# Patient Record
Sex: Female | Born: 1980
Health system: Southern US, Community
[De-identification: ages and names within clinical notes are randomized; demographics above are authoritative.]

## PROBLEM LIST (undated history)

## (undated) ENCOUNTER — Ambulatory Visit: Admission: EM | Payer: 59 | Source: Home / Self Care

## (undated) DIAGNOSIS — R87629 Unspecified abnormal cytological findings in specimens from vagina: Secondary | ICD-10-CM

## (undated) HISTORY — DX: Unspecified abnormal cytological findings in specimens from vagina: R87.629

## (undated) HISTORY — PX: TOE SURGERY: SHX1073

---

## 2004-03-09 ENCOUNTER — Other Ambulatory Visit: Admission: RE | Admit: 2004-03-09 | Discharge: 2004-03-09 | Payer: Self-pay | Admitting: Obstetrics and Gynecology

## 2004-03-14 ENCOUNTER — Ambulatory Visit (HOSPITAL_COMMUNITY): Admission: RE | Admit: 2004-03-14 | Discharge: 2004-03-14 | Payer: Self-pay | Admitting: Obstetrics and Gynecology

## 2004-05-25 ENCOUNTER — Ambulatory Visit (HOSPITAL_COMMUNITY): Admission: RE | Admit: 2004-05-25 | Discharge: 2004-05-25 | Payer: Self-pay | Admitting: Obstetrics and Gynecology

## 2004-08-15 ENCOUNTER — Ambulatory Visit (HOSPITAL_COMMUNITY): Admission: RE | Admit: 2004-08-15 | Discharge: 2004-08-15 | Payer: Self-pay | Admitting: Obstetrics and Gynecology

## 2004-08-21 ENCOUNTER — Ambulatory Visit: Payer: Self-pay | Admitting: *Deleted

## 2004-08-21 ENCOUNTER — Inpatient Hospital Stay (HOSPITAL_COMMUNITY): Admission: AD | Admit: 2004-08-21 | Discharge: 2004-08-28 | Payer: Self-pay | Admitting: Obstetrics & Gynecology

## 2005-11-28 ENCOUNTER — Other Ambulatory Visit: Admission: RE | Admit: 2005-11-28 | Discharge: 2005-11-28 | Payer: Self-pay | Admitting: Obstetrics and Gynecology

## 2005-11-29 IMAGING — US US OB COMP +14 WK
1 series · 13 of 28 positions shown · non-contrast
Comparison: none

CLINICAL DATA: Twin gestation with 19 week 3 day assigned gestational age by prior ultrasound.  Evaluate fetal anatomy and growth.

[Series 1: unknown · 0.23mm/px · 13 of 148 slices shown]
[im 6/148]
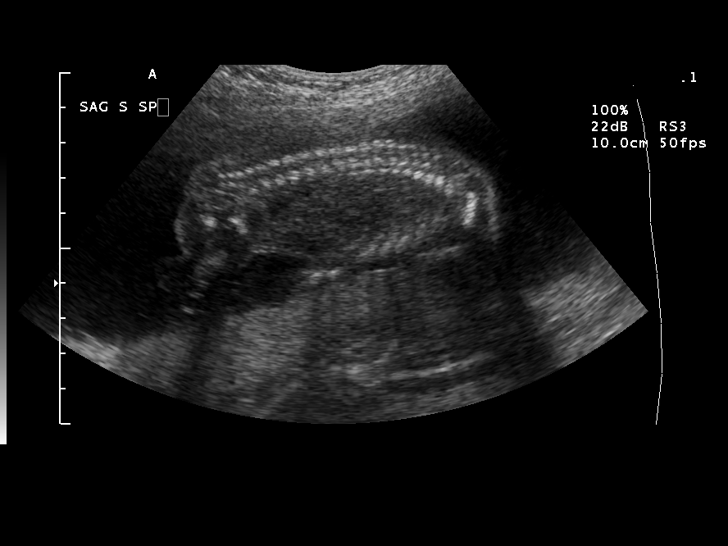
[im 17/148]
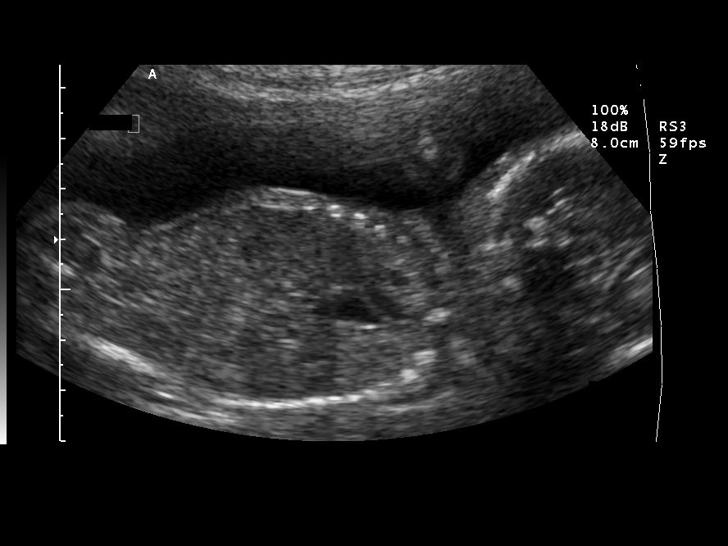
[im 28/148]
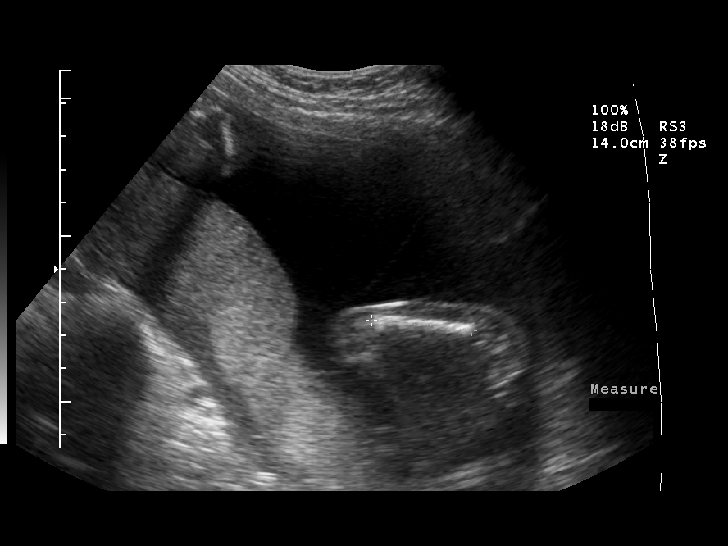
[im 39/148]
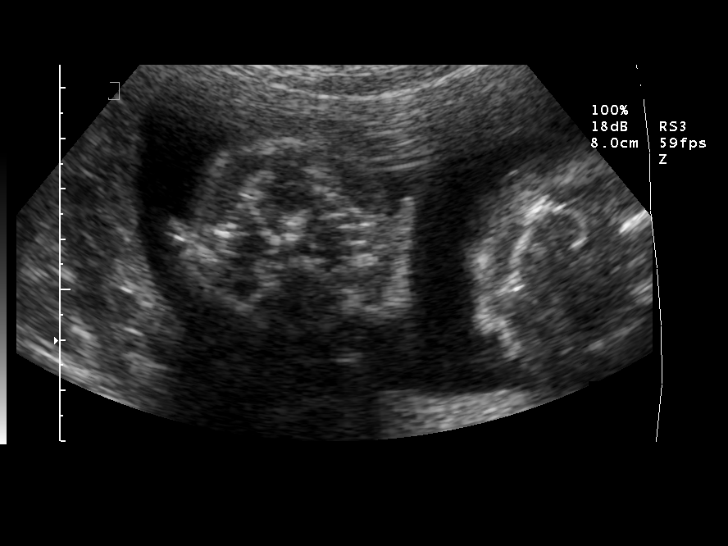
[im 50/148]
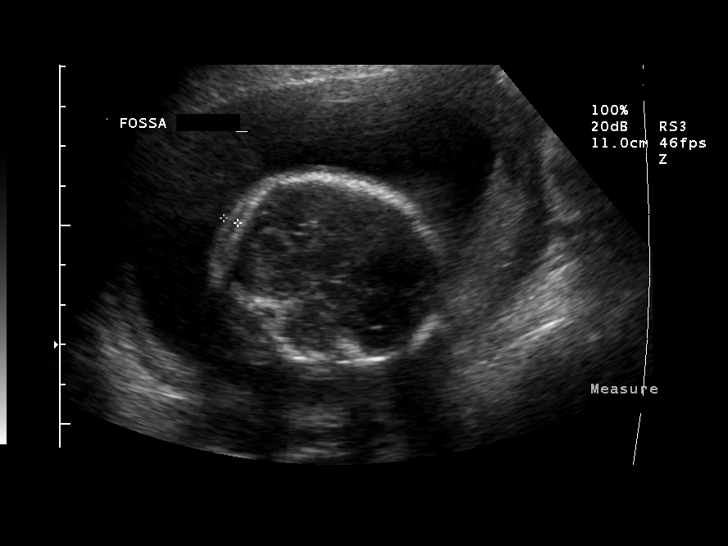
[im 60/148]
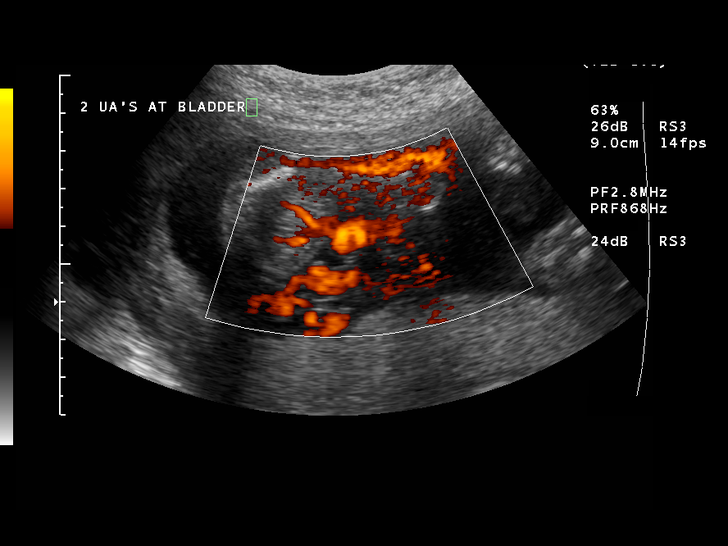
[im 77/148]
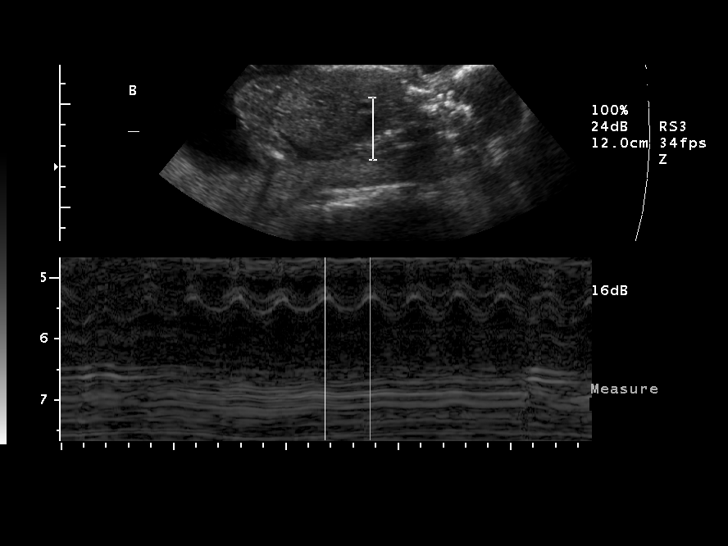
[im 88/148]
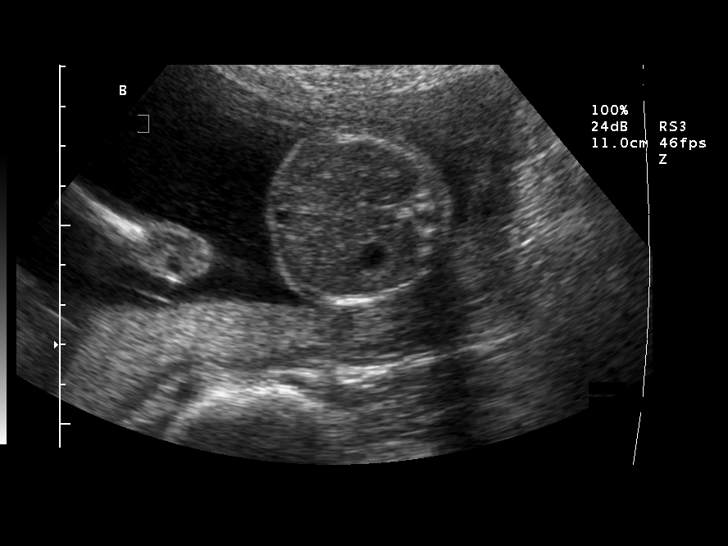
[im 99/148]
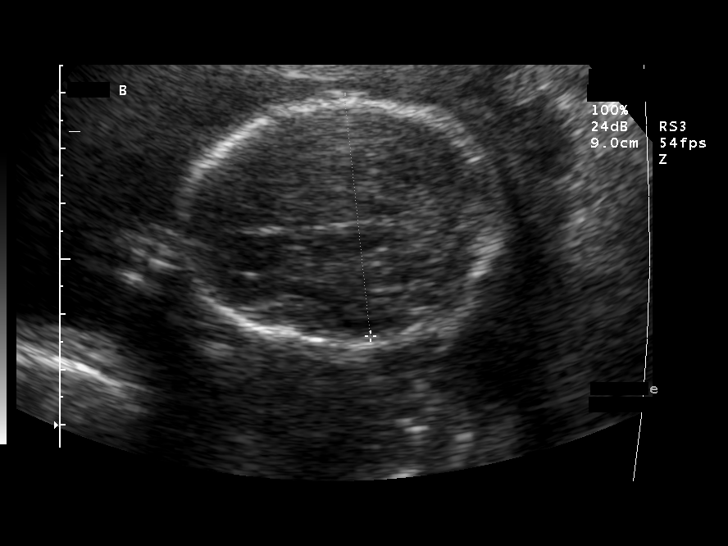
[im 109/148]
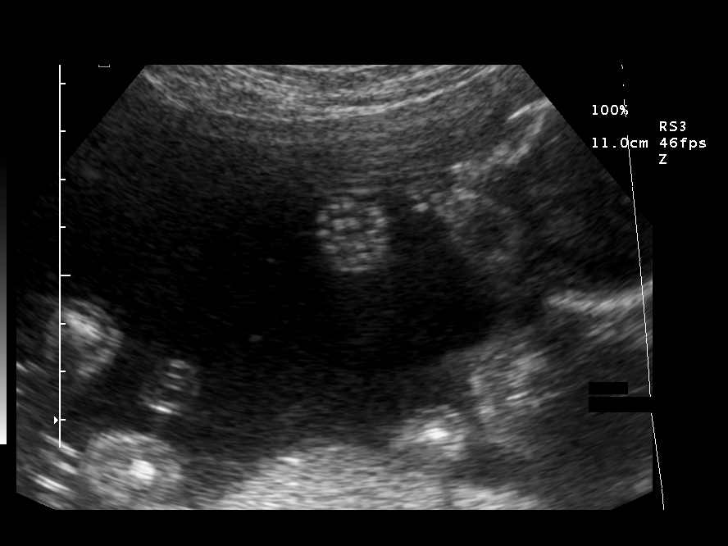
[im 120/148]
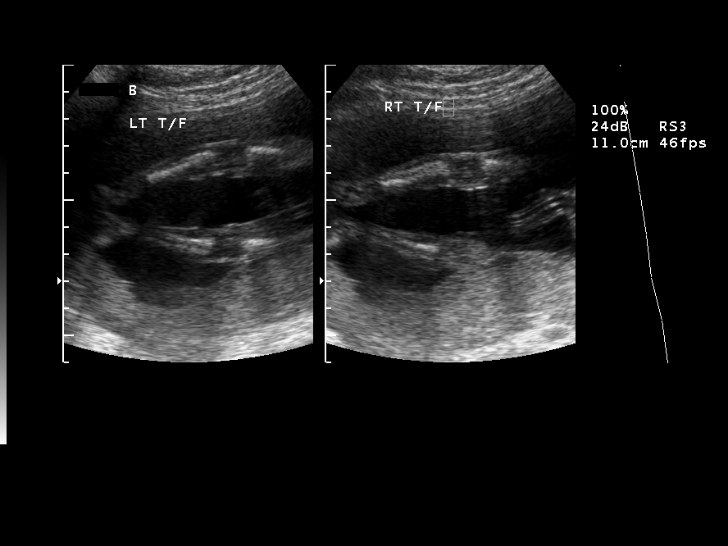
[im 131/148]
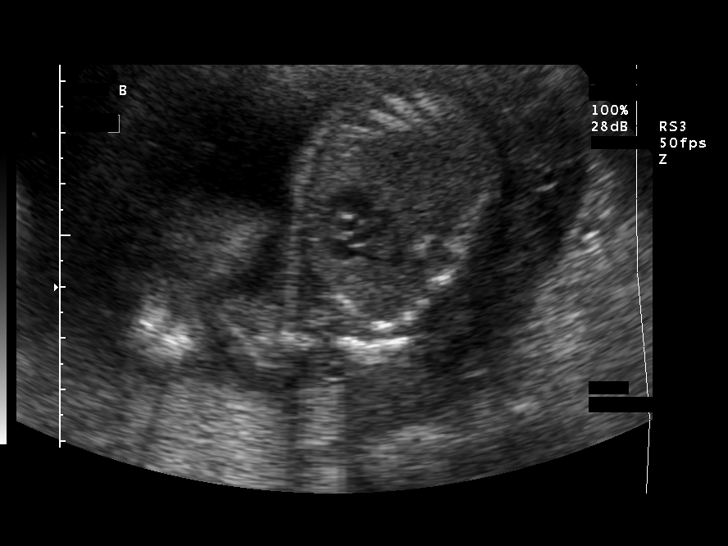
[im 142/148]
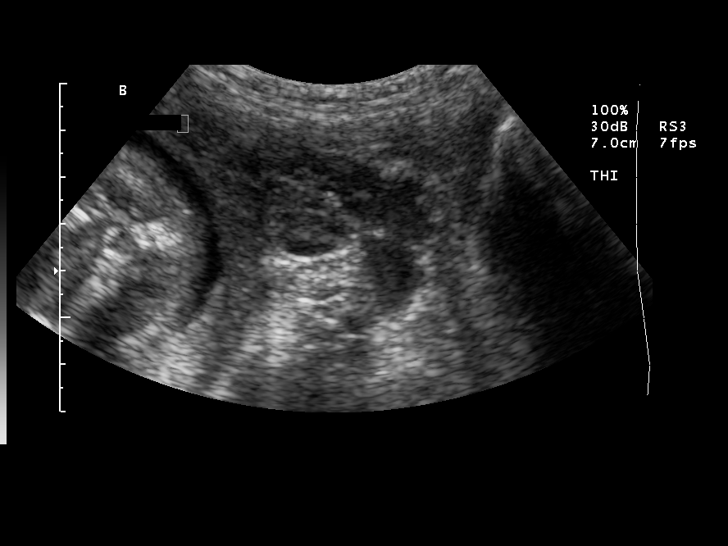

[13 of 28 positions shown; findings below may reference images not displayed]

TWIN OBSTETRICAL ULTRASOUND
A living twin gestation is seen with a thin separating membrane.   This is consistent with a monochorionic, diamniotic twin gestation.  

TWIN A:
Heart Rate:  162
Movement:  Yes
Breathing:  No
Presentation:  Transverse on maternal right side
Placental Location:  Posterior
Grade:  I
Previa: No
Amniotic Fluid (Subjective):  Normal
Amniotic Fluid (Objective):   5.2 cm Vertical pocket 

FETAL BIOMETRY (TWIN A)
BPD:  4.5 cm   19 w 3 d
HC:  16.8 cm   19 w 3 d
AC:  14.0 cm   19 w 2 d
FL:  3.0 cm   19 w 2 d

Mean GA:  19 w 3 d

FETAL ANATOMY (TWIN A)
Lateral Ventricles:  Visualized 
Thalami/CSP:    Visualized 
Posterior Fossa:  Visualized 
Nuchal Region:  Visualized 
Spine:  Visualized 
4 Chamber Heart on L:  Visualized 
Stomach on Left:  Visualized 
3 Vessel Cord:  Visualized 
Cord Insertion Site:  Visualized 
Kidneys:  Visualized 
Bladder:  Visualized 
Extremities:  Visualized 

Additional Anatomy Visualized:  LVOT, upper lip, orbits, profile, diaphragm, heel, aortic arch, female genitalia and nasal bone.
TWIN B:
Heart Rate:  150
Movement:  Yes
Breathing:  No
Presentation:  Transverse on maternal left side  
Placental Location:  Posterior
Grade:  I
Previa:  No
Amniotic Fluid (Subjective):  Normal
Amniotic Fluid (Objective):   3.4 cm Vertical pocket 

FETAL BIOMETRY (TWIN B)
BPD:  4.4 cm   19 w 1 d
HC:  16.6 cm   19 w 2 d
AC:  14.8 cm   20 w 0 d
FL:  3.1 cm   19 w 5 d

Mean GA:  19 w 4 d

FETAL ANATOMY (TWIN B)
Lateral Ventricles:  Visualized 
Thalami/CSP:  Visualized 
Posterior Fossa:  Visualized 
Nuchal Region:  Visualized 
Spine:  Visualized 
4 Chamber Heart on L:  Visualized 
Stomach on Left:  Visualized 
3 Vessel Cord:  Visualized 
Cord Insertion Site:  Visualized 
Kidneys:  Visualized 
Bladder:  Visualized 
Extremities:  Visualized 

Additional Anatomy Visualized:  RVOT, upper lip, diaphragm, heel, 5th digit, ductal arch, aortic arch, female genitalia and nasal bone

MATERNAL FINDINGS
Cervix:  3.6 cm Transabdominally
IMPRESSION: Living monochorionic, diamniotic twin gestation with assigned gestational age of 19 weeks 3 days by prior ultrasound.  Appropriate and concordant twin fetal growth.
No evidence of anatomic abnormality for either fetus.
Normal amniotic fluid volumes.

## 2006-02-27 IMAGING — US US OB LIMITED
1 series · 13 of 28 positions shown · non-contrast
Comparison: none

CLINICAL DATA: 32 week 2 day assigned gestational age.  Twin pregnancy.  On magnesium for preterm labor.  Fetal size discrepancy noted on outside ultrasound.

[Series 1: us ob limited · 0.35mm/px · 13 of 44 slices shown]
[im 2/44]
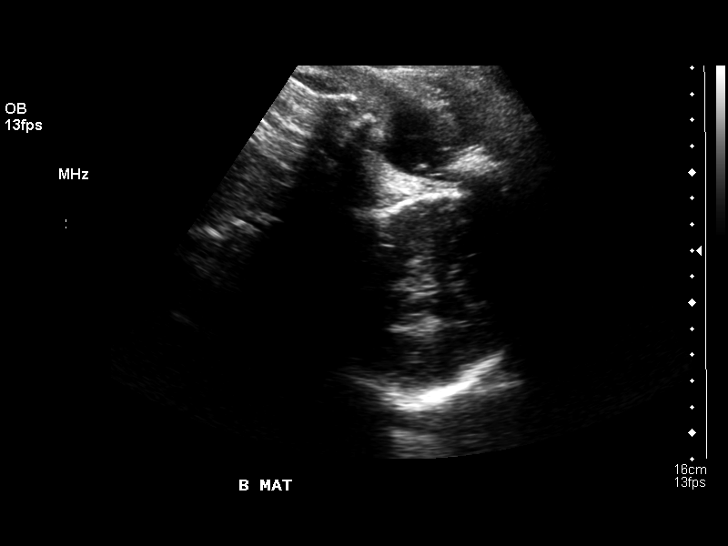
[im 5/44]
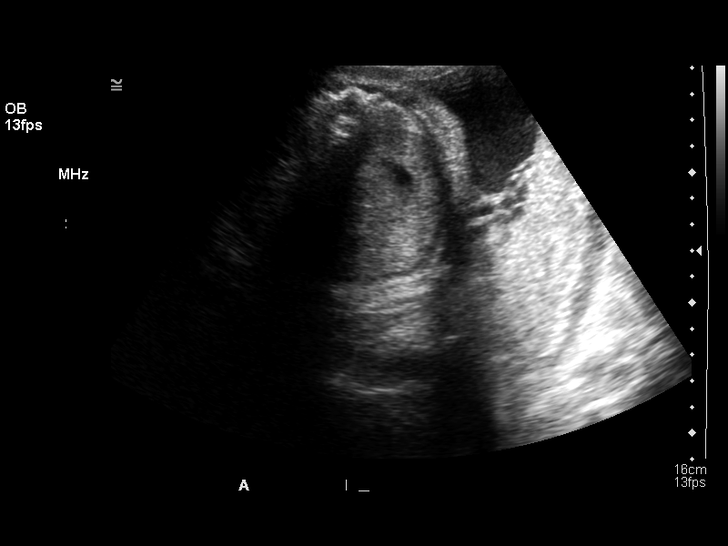
[im 8/44]
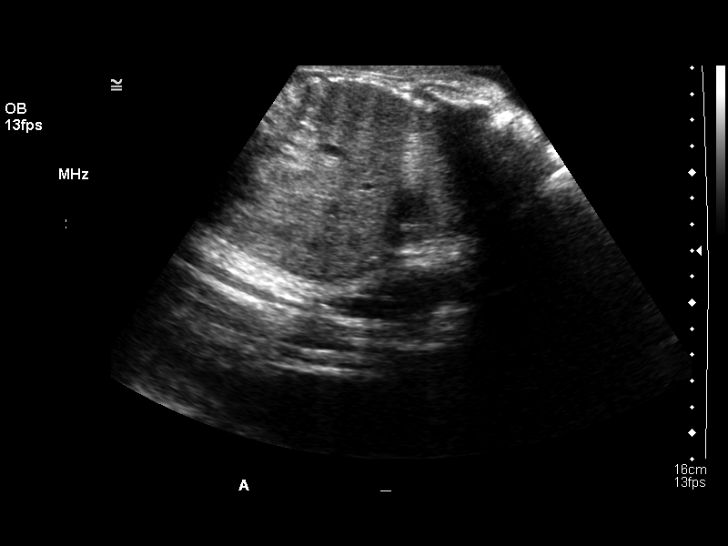
[im 12/44]
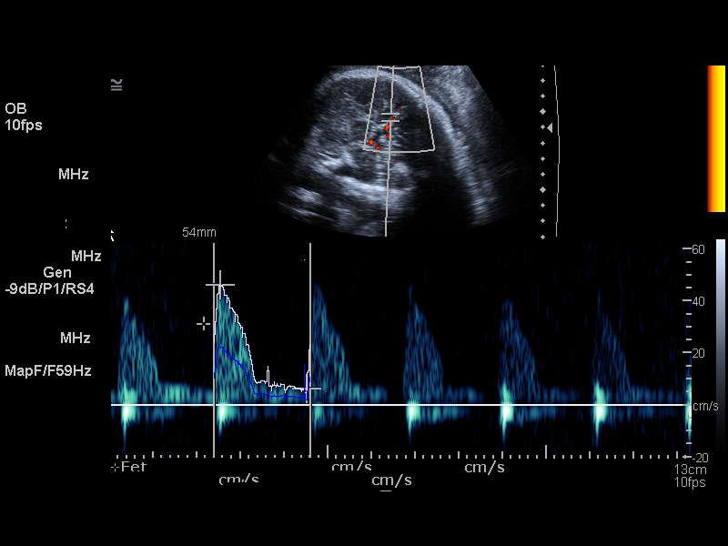
[im 15/44]
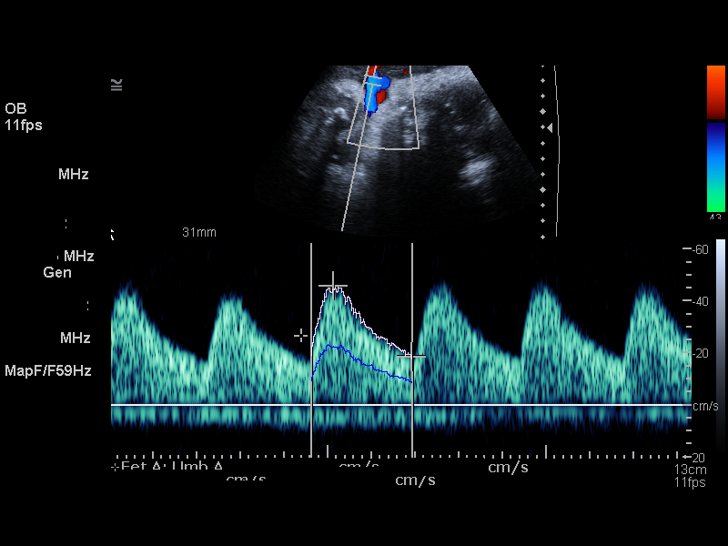
[im 18/44]
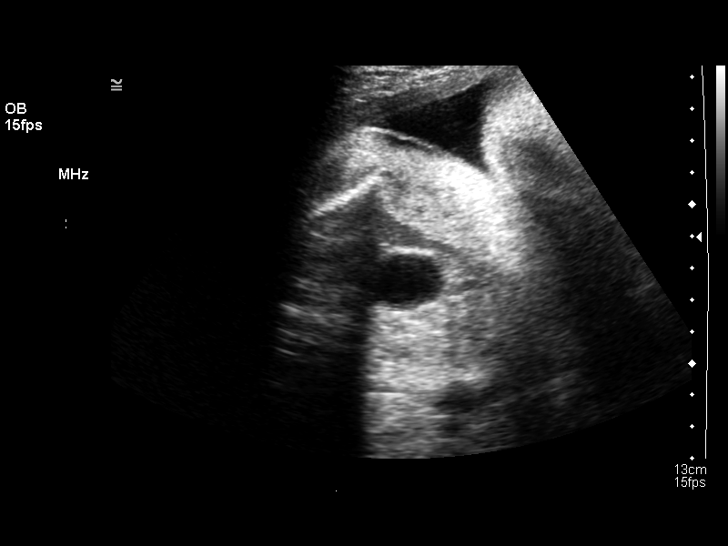
[im 23/44]
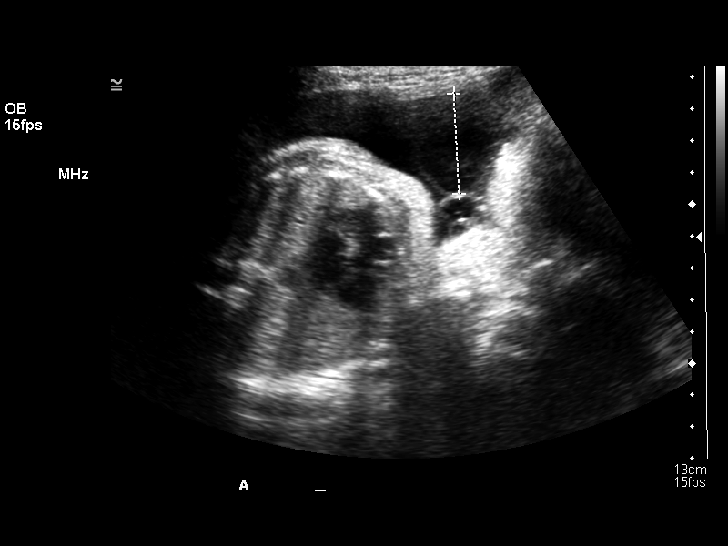
[im 26/44]
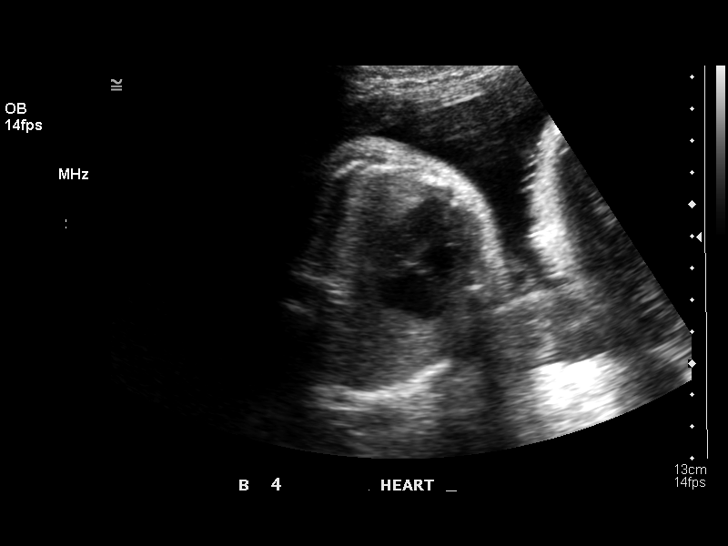
[im 29/44]
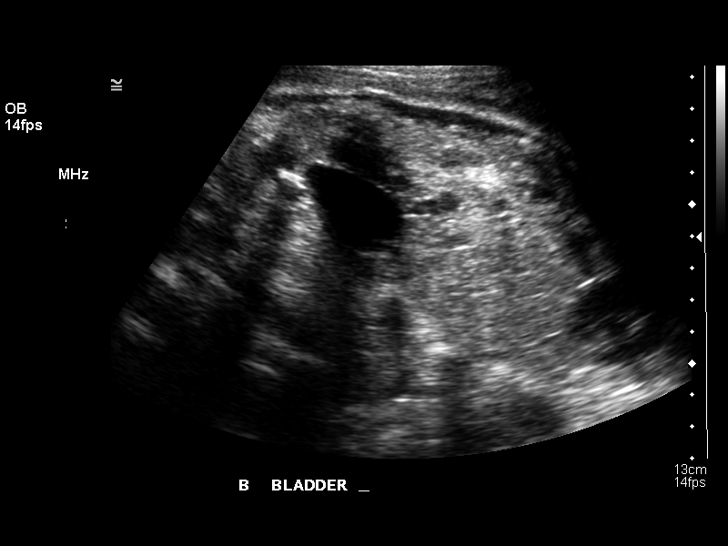
[im 32/44]
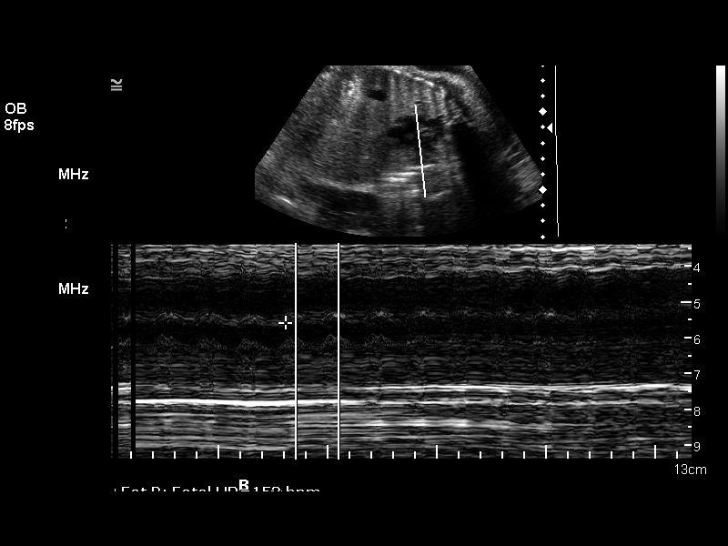
[im 36/44]
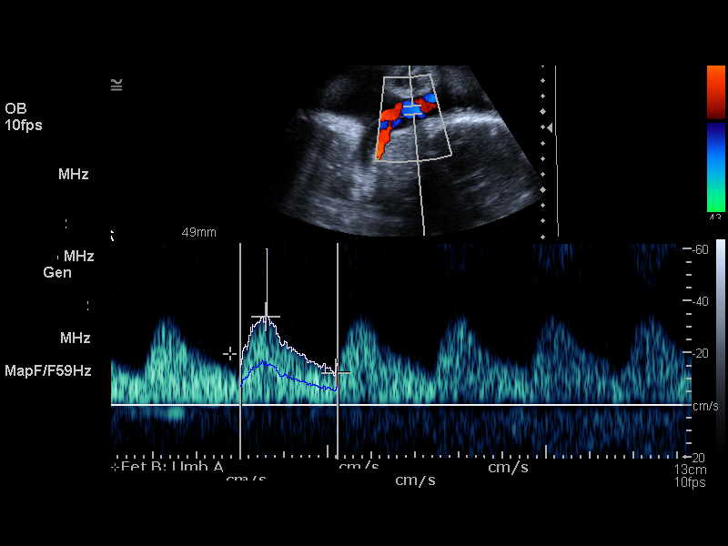
[im 39/44]
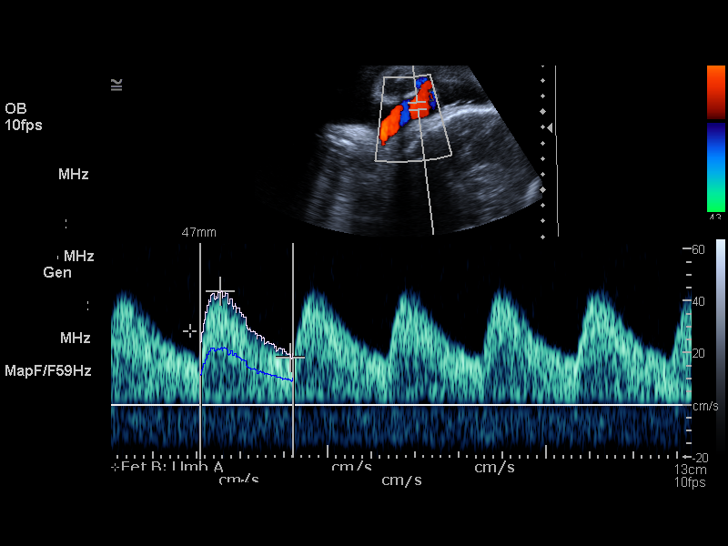
[im 42/44]
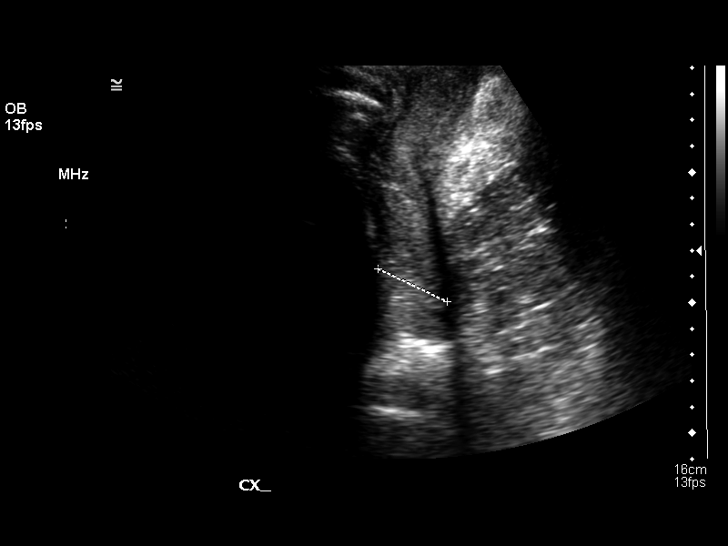

[13 of 28 positions shown; findings below may reference images not displayed]

TWIN GESTATION LIMITED OBSTETRICAL ULTRASOUND:

A living twin gestation is present.  A separating membrane is seen.

TWIN A (NONPRESENTING):
Heart Rate:  
Movement:  Yes
Breathing:  No
Presentation:  Cephalic/oblique on the maternal left
Placental Location:  Posterior
Grade:  I
Previa:  No
Amniotic Fluid (Subjective):  Normal
Amniotic Fluid (Objective):  3.2 cm Vertical pocket 

Fetal measurements and complete anatomic evaluation were not requested.  The following fetal anatomy for Twin A was visualized during this exam:  Lateral ventricles, four chamber heart, stomach, kidneys, and bladder.

TWIN B (PRESENTING):
Heart Rate: 153 
Movement:  Yes
Breathing:  Yes
Presentation:  Cephalic on the maternal right side
Placental Location:  Posterior
Grade:  I
Previa:  No
Amniotic Fluid (Subjective):  Normal
Amniotic Fluid (Objective):  3.2 cm Vertical pocket 

Fetal measurements and complete anatomic evaluation were not requested.  The following fetal anatomy for Twin B was visualized during this exam:  Lateral ventricles, stomach, kidneys, and bladder.  

MATERNAL UTERINE AND ADNEXAL FINDINGS
Cervix:  2.9 cm Translabially
IMPRESSION: Living monochorionic, diamniotic twin gestation.  Normal amniotic fluid volume for both fetuses.  
Borderline cervix measuring approximately 2.9 cm in length.  
DOPPLER ULTRASOUND OF TWIN GESTATION:

TWIN A:
Umbilical Artery S/D Ratio:  2.47   (NL< 3.85)

Middle Cerebral Artery PI: 2.14    (NL> 1.44)

TWIN B:
Umbilical Artery S/D Ratio: 2.50   (NL< 3.85)

Middle Cerebral Artery PI: 1.64   (NL> 1.44)
IMPRESSION: Both umbilical artery and fetal middle cerebral Doppler measurements are within normal limits for both fetuses.

## 2010-10-13 ENCOUNTER — Encounter: Payer: Self-pay | Admitting: Obstetrics and Gynecology

## 2014-01-01 ENCOUNTER — Ambulatory Visit (INDEPENDENT_AMBULATORY_CARE_PROVIDER_SITE_OTHER): Payer: Managed Care, Other (non HMO) | Admitting: Family Medicine

## 2014-01-01 VITALS — BP 102/64 | HR 73 | Temp 98.5°F | Resp 16 | Ht 62.0 in | Wt 140.0 lb

## 2014-01-01 DIAGNOSIS — Z111 Encounter for screening for respiratory tuberculosis: Secondary | ICD-10-CM

## 2014-01-01 DIAGNOSIS — Z23 Encounter for immunization: Secondary | ICD-10-CM

## 2014-01-01 NOTE — Progress Notes (Signed)
  Tuberculosis Risk Questionnaire  1. Yes  Were you born outside the [place] in one of the following parts of the world: [place], [place], [place], [place] or [place]?    2. No Have you traveled outside the [place] and lived for more than one month in one of the following parts of the world: [place], [place], [place], [place] or [place]?    3. No Do you have a compromised immune system such as from any of the following conditions:HIV/AIDS, organ or bone marrow transplantation, diabetes, immunosuppressive medicines (e.g. Prednisone, Remicaide), leukemia, lymphoma, cancer of the head or neck, gastrectomy or jejunal bypass, end-stage renal disease (on dialysis), or silicosis?     4. Yes Have you ever or do you plan on working in: a residential care center, a health care facility, a jail or prison or homeless shelter?    5. Yes  Have you ever: injected illegal drugs, used crack cocaine, lived in a homeless shelter  or been in jail or prison?     6. No Have you ever been exposed to anyone with infectious tuberculosis?    Tuberculosis Symptom Questionnaire  Do you currently have any of the following symptoms?  1. No Unexplained cough lasting more than 3 weeks?   2. No Unexplained fever lasting more than 3 weeks.   3. No Night Sweats (sweating that leaves the bedclothes and sheets wet)     4. No Shortness of Breath   5. No Chest Pain   6. No Unintentional weight loss    7. No Unexplained fatigue (very tired for no reason)

## 2014-01-01 NOTE — Progress Notes (Signed)
TB SCREENING in patient with history of POSITIVE reaction to TB Skin Test  Has the patient experienced any of the following in the last 12 months? Unexplained productive cough no Unexplained weight loss no Unexplained appetite loss no Unexplained fever no  Night sweats no Shortness of breath no   Chest pain no Increased fatigue no Exposure to Tuberculosis no Development of Diabetes no

## 2014-01-01 NOTE — Patient Instructions (Signed)
Please come for your TB reading in 48- 72 hours.   Please call and make an appt for your 2nd Hep B shot.  You will ask for an appointment at "the [facility] appointment center." This will be due in 1 month- wait at least 4 weeks before your next shot.

## 2014-01-01 NOTE — Progress Notes (Signed)
[facility] [address] [place] [address] [phone]  Date:  01/01/2014   Name:  Tiffany Glass   DOB:  10-31-1980   MRN:  [id number]  PCP:  No primary provider on file.    Chief Complaint: Establish Care and Immunizations   History of Present Illness:  Tiffany Glass is a 33 y.o. 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  She needs to have the hep B series and a PPD- 2 step test.  She has not yet had the hep B series as far as she knows She is not pregnant or breastfeeding at this time She is generally healthy.  Has an IUD  There are no active problems to display for this patient.   History reviewed. No pertinent past medical history.  Past Surgical History  Procedure Laterality Date  . Cesarean section      History  Substance Use Topics  . Smoking status: Never Smoker   . Smokeless tobacco: Not on file  . Alcohol Use: No    Family History  Problem Relation Age of Onset  . Diabetes Mother   . Heart disease Mother   . Hyperlipidemia Mother   . Hypertension Mother   . Stroke Mother   . Diabetes Father   . Sudden death Brother 6  . Diabetes Maternal Grandmother   . Hyperlipidemia Maternal Grandmother   . Hypertension Maternal Grandmother   . Stroke Maternal Grandmother   . Diabetes Paternal Grandmother     No Known Allergies  Medication list has been reviewed and updated.  No current outpatient prescriptions on file prior to visit.   No current facility-administered medications on file prior to visit.    Review of Systems:  As per HPI- otherwise negative.   Physical Examination: Filed Vitals:   01/01/14 1207  BP: 102/64  Pulse: 73  Temp: 98.5 F (36.9 C)  Resp: 16   Filed Vitals:   01/01/14 1207  Height: 5\' 2"  (1.575 m)  Weight: 140 lb (63.504 kg)   Body mass index is 25.6 kg/(m^2). Ideal Body Weight: Weight in (lb) to have BMI = 25: 136.4  GEN:  WDWN, NAD, Non-toxic, A & O x 3 HEENT: Atraumatic, Normocephalic. Neck supple. No masses, No LAD. Ears and Nose: No external deformity. CV: RRR, No M/G/R. No JVD. No thrill. No extra heart sounds. PULM: CTA B, no wheezes, crackles, rhonchi. No retractions. No resp. distress. No accessory muscle use. EXTR: No c/c/e NEURO Normal gait.  PSYCH: Normally interactive. Conversant. Not depressed or anxious appearing.  Calm demeanor.    Assessment and Plan: Immunization due - Plan: Hepatitis B vaccine adult IM  Screening-pulmonary TB - Plan: TB Skin Test   hep B #1 and 1st step PPD today.  Follow-up for read, then 2nd ppd in 1-3 weeks. Next hep B shot in one month.   Signed Lamar Blinks, MD

## 2014-01-04 ENCOUNTER — Ambulatory Visit (INDEPENDENT_AMBULATORY_CARE_PROVIDER_SITE_OTHER): Payer: Managed Care, Other (non HMO)

## 2014-01-04 DIAGNOSIS — Z23 Encounter for immunization: Secondary | ICD-10-CM

## 2014-01-04 DIAGNOSIS — Z111 Encounter for screening for respiratory tuberculosis: Secondary | ICD-10-CM

## 2014-01-04 LAB — TB SKIN TEST
Induration: 0 mm
TB Skin Test: NEGATIVE

## 2014-01-12 ENCOUNTER — Ambulatory Visit (INDEPENDENT_AMBULATORY_CARE_PROVIDER_SITE_OTHER): Payer: Managed Care, Other (non HMO)

## 2014-01-12 DIAGNOSIS — Z111 Encounter for screening for respiratory tuberculosis: Secondary | ICD-10-CM

## 2014-01-15 ENCOUNTER — Ambulatory Visit (INDEPENDENT_AMBULATORY_CARE_PROVIDER_SITE_OTHER): Payer: Managed Care, Other (non HMO) | Admitting: *Deleted

## 2014-01-15 DIAGNOSIS — Z111 Encounter for screening for respiratory tuberculosis: Secondary | ICD-10-CM

## 2014-01-15 LAB — TB SKIN TEST
Induration: 0 mm
TB Skin Test: NEGATIVE

## 2014-01-15 NOTE — Progress Notes (Signed)
Pt here for TB read only

## 2014-01-15 NOTE — Progress Notes (Deleted)
   Subjective:    Patient ID: Tiffany Glass, female    DOB: 1981-02-16, 33 y.o.   MRN: [id number]  HPI    Review of Systems     Objective:   Physical Exam        Assessment & Plan:

## 2014-01-31 ENCOUNTER — Ambulatory Visit (INDEPENDENT_AMBULATORY_CARE_PROVIDER_SITE_OTHER): Payer: Managed Care, Other (non HMO) | Admitting: Radiology

## 2014-01-31 DIAGNOSIS — Z23 Encounter for immunization: Secondary | ICD-10-CM

## 2014-01-31 MED ORDER — HEPATITIS B VAC RECOMBINANT 10 MCG/ML IJ SUSP: 1.0000 mL | Freq: Once | INTRAMUSCULAR | Status: DC

## 2014-05-09 DIAGNOSIS — T783XXA Angioneurotic edema, initial encounter: Secondary | ICD-10-CM | POA: Insufficient documentation

## 2014-07-03 ENCOUNTER — Ambulatory Visit (INDEPENDENT_AMBULATORY_CARE_PROVIDER_SITE_OTHER): Payer: Managed Care, Other (non HMO) | Admitting: *Deleted

## 2014-07-03 DIAGNOSIS — Z23 Encounter for immunization: Secondary | ICD-10-CM

## 2014-12-12 DIAGNOSIS — N760 Acute vaginitis: Secondary | ICD-10-CM | POA: Insufficient documentation

## 2014-12-30 DIAGNOSIS — L501 Idiopathic urticaria: Secondary | ICD-10-CM | POA: Insufficient documentation

## 2014-12-30 DIAGNOSIS — J302 Other seasonal allergic rhinitis: Secondary | ICD-10-CM | POA: Insufficient documentation

## 2015-03-03 DIAGNOSIS — Z111 Encounter for screening for respiratory tuberculosis: Secondary | ICD-10-CM | POA: Insufficient documentation

## 2016-07-05 DIAGNOSIS — G44209 Tension-type headache, unspecified, not intractable: Secondary | ICD-10-CM | POA: Insufficient documentation

## 2016-08-16 DIAGNOSIS — M549 Dorsalgia, unspecified: Secondary | ICD-10-CM | POA: Insufficient documentation

## 2016-08-16 DIAGNOSIS — M542 Cervicalgia: Secondary | ICD-10-CM | POA: Insufficient documentation

## 2017-02-28 DIAGNOSIS — B009 Herpesviral infection, unspecified: Secondary | ICD-10-CM | POA: Insufficient documentation

## 2018-08-12 DIAGNOSIS — D219 Benign neoplasm of connective and other soft tissue, unspecified: Secondary | ICD-10-CM | POA: Insufficient documentation

## 2022-01-16 ENCOUNTER — Ambulatory Visit: Payer: Self-pay

## 2022-01-16 NOTE — ED Triage Notes (Signed)
Pt present cough with SOB. Symptoms started on Saturday. Pt  state her cough is dry and her cough is not all the time it comes and goes.  ?

## 2022-01-17 ENCOUNTER — Ambulatory Visit
Admission: RE | Admit: 2022-01-17 | Discharge: 2022-01-17 | Disposition: A | Discharge status: Home / Self Care | Payer: 59 | Source: Ambulatory Visit | Attending: Emergency Medicine | Admitting: Emergency Medicine

## 2022-01-17 ENCOUNTER — Ambulatory Visit (INDEPENDENT_AMBULATORY_CARE_PROVIDER_SITE_OTHER): Payer: 59

## 2022-01-17 VITALS — BP 116/78 | HR 77 | Temp 98.1°F | Resp 18

## 2022-01-17 DIAGNOSIS — J302 Other seasonal allergic rhinitis: Secondary | ICD-10-CM

## 2022-01-17 DIAGNOSIS — R058 Other specified cough: Secondary | ICD-10-CM

## 2022-01-17 MED ORDER — FEXOFENADINE HCL 180 MG PO TABS: 180.0000 mg | ORAL_TABLET | Freq: Every day | ORAL | 1 refills | Status: DC

## 2022-01-17 MED ORDER — FLUTICASONE PROPIONATE 50 MCG/ACT NA SUSP: 1.0000 | Freq: Every day | NASAL | 1 refills | Status: DC

## 2022-01-17 MED ORDER — DEXAMETHASONE SODIUM PHOSPHATE 10 MG/ML IJ SOLN
10.0000 mg | Freq: Once | INTRAMUSCULAR | Status: AC
  Administered 2022-01-17: 10 mg via INTRAMUSCULAR

## 2022-01-17 NOTE — Discharge Instructions (Signed)
Your chest x-ray was not concerning for pneumonia, pleural effusion or any other acute pulmonary disease. ? ?Your symptoms and my physical exam findings are concerning for exacerbation of your underlying allergies.  It is important that you begin your allergy regimen now and are consistent with taking allergy medications exactly as prescribed.  Allergy medications are preventative and therefore only work well when they are taken daily, not "as needed". ?  ?Decadron IM (dexamethasone):  To quickly address your significant respiratory inflammation, you were provided with an injection of Decadron in the office today.  You should continue to feel the full benefit of the steroid for the next 12-24 hours.  ?  ?Allegra (fexofenadine): This is an excellent second-generation antihistamine that helps to reduce respiratory inflammatory response to environmental allergens.  This medication is not known to cause daytime sleepiness so it can be taken in the daytime.  If you find that it does make you sleepy, please feel free to take it at bedtime. ?  ?Flonase (fluticasone): This is a steroid nasal spray that you use once daily, 1 spray in each nare.  This medication does not work well if you decide to use it only used as you feel you need to, it works best used on a daily basis.  After 3 to 5 days of use, you will notice significant reduction of the inflammation and mucus production that is currently being caused by exposure to allergens, whether seasonal or environmental.  The most common side effect of this medication is nosebleeds.  If you experience a nosebleed, please discontinue use for 1 week, then feel free to resume.  I have provided you with a prescription but you can also purchase this medication over-the-counter if your insurance will not cover it. ?  ?If you find that you have not had significant relief of your symptoms in the next 7 to 10 days, please follow-up with your primary care provider or return here to  urgent care for repeat evaluation and further recommendations. ?  ?Thank you for visiting urgent care today.  We appreciate the opportunity to participate in your care. ? ?

## 2022-01-17 NOTE — ED Triage Notes (Signed)
Pt c/o SOB and a dry cough that began Saturday. ?

## 2022-01-17 NOTE — ED Provider Notes (Signed)
?[facility] ? ? ? ?CSN: [id number] ?Arrival date & time: 01/17/22  1251 ?  ? ?HISTORY  ? ?Chief Complaint  ?Patient presents with  ? Cough  ?  Dry cough and shortnes of breath - Entered by patient  ? Shortness of Breath  ? ?HPI ?Tiffany Glass is a 41 y.o. female. Patient presents urgent care complaining of shortness of breath and a dry cough that began 5 days ago.  Patient states she has not had any other symptoms including congestion, rhinitis, postnasal drip, excessive throat clearing, headache, fever, chills, sore throat, nausea, vomiting, diarrhea.  Patient states that the shortness of breath usually occurs when coughing but that she also sometimes feels like she has difficulty taking a deep breath, states the cough and this difficulty is worse at night.  Patient states she is lived in the [place] area for many years.  Patient denies history of allergies and asthma. ? ?The history is provided by the patient.  ?History reviewed. No pertinent past medical history. ?Patient Active Problem List  ? Diagnosis Date Noted  ? Fibroid 08/12/2018  ? HSV infection 02/28/2017  ? Upper back pain on right side 08/16/2016  ? Neck pain 08/16/2016  ? Tension type headache 07/05/2016  ? PPD screening test 03/03/2015  ? Perennial allergic rhinitis with seasonal variation 12/30/2014  ? Chronic idiopathic urticaria 12/30/2014  ? Bacterial vaginosis 12/12/2014  ? Angioedema 05/09/2014  ? ?Past Surgical History:  ?Procedure Laterality Date  ? CESAREAN SECTION    ? ?OB History   ?No obstetric history on file. ?  ? ?Home Medications   ? ?Prior to Admission medications   ?Medication Sig Start Date End Date Taking? Authorizing Provider  ?fexofenadine (ALLEGRA) 180 MG tablet Take 1 tablet (180 mg total) by mouth daily. 01/17/22 07/16/22 Yes Lynden Oxford Scales, PA-C  ?fluticasone (FLONASE) 50 MCG/ACT nasal spray Place 1 spray into both nostrils daily. Begin by using 2 sprays in each nare daily for 3 to 5 days, then decrease  to 1 spray in each nare daily. 01/17/22  Yes Lynden Oxford Scales, PA-C  ?clobetasol ointment (TEMOVATE) 0.05 % Apply 1-2 times daily to thick areas on hands. Do not apply to normal skin. 10/14/18   [provider]  ?valACYclovir (VALTREX) 500 MG tablet Take by mouth. 08/12/18   [provider]  ? ?Family History ?Family History  ?Problem Relation Age of Onset  ? Diabetes Mother   ? Heart disease Mother   ? Hyperlipidemia Mother   ? Hypertension Mother   ? Stroke Mother   ? Diabetes Father   ? Sudden death Brother 55  ? Diabetes Maternal Grandmother   ? Hyperlipidemia Maternal Grandmother   ? Hypertension Maternal Grandmother   ? Stroke Maternal Grandmother   ? Diabetes Paternal Grandmother   ? ?Social History ?Social History  ? ?Tobacco Use  ? Smoking status: Never  ?Substance Use Topics  ? Alcohol use: No  ? Drug use: No  ? ?Allergies   ?Patient has no known allergies. ? ?Review of Systems ?Review of Systems ?Pertinent findings noted in history of present illness.  ? ?Physical Exam ?Triage Vital Signs ?ED Triage Vitals  ?Enc Vitals Group  ?   BP 07/20/21 0827 (!) 147/82  ?   Pulse Rate 07/20/21 0827 72  ?   Resp 07/20/21 0827 18  ?   Temp 07/20/21 0827 98.3 ?F (36.8 ?C)  ?   Temp Source 07/20/21 0827 Oral  ?   SpO2 07/20/21  0827 98 %  ?   Weight --   ?   Height --   ?   Head Circumference --   ?   Peak Flow --   ?   Pain Score 07/20/21 0826 5  ?   Pain Loc --   ?   Pain Edu? --   ?   Excl. in [place]? --   ?No data found. ? ?Updated Vital Signs ?BP 116/78 (BP Location: Right Arm)   Pulse 77   Temp 98.1 ?F (36.7 ?C) (Oral)   Resp 18   LMP 01/17/2022   SpO2 98%  ? ?Physical Exam ?Vitals and nursing note reviewed.  ?Constitutional:   ?   General: She is not in acute distress. ?   Appearance: Normal appearance. She is not ill-appearing.  ?HENT:  ?   Head: Normocephalic and atraumatic.  ?   Salivary Glands: Right salivary gland is not diffusely enlarged or tender. Left salivary gland is not diffusely  enlarged or tender.  ?   Right Ear: Ear canal and external ear normal. No drainage. A middle ear effusion is present. There is no impacted cerumen. Tympanic membrane is bulging. Tympanic membrane is not injected or erythematous.  ?   Left Ear: Ear canal and external ear normal. No drainage. A middle ear effusion is present. There is no impacted cerumen. Tympanic membrane is bulging. Tympanic membrane is not injected or erythematous.  ?   Ears:  ?   Comments: Bilateral EACs normal, both TMs bulging with clear fluid ?   Nose: Rhinorrhea present. No nasal deformity, septal deviation, signs of injury, nasal tenderness, mucosal edema or congestion. Rhinorrhea is clear.  ?   Right Nostril: Occlusion present. No foreign body, epistaxis or septal hematoma.  ?   Left Nostril: Occlusion present. No foreign body, epistaxis or septal hematoma.  ?   Right Turbinates: Enlarged, swollen and pale.  ?   Left Turbinates: Enlarged, swollen and pale.  ?   Right Sinus: No maxillary sinus tenderness or frontal sinus tenderness.  ?   Left Sinus: No maxillary sinus tenderness or frontal sinus tenderness.  ?   Mouth/Throat:  ?   Lips: Pink. No lesions.  ?   Mouth: Mucous membranes are moist. No oral lesions.  ?   Pharynx: Oropharynx is clear. Uvula midline. No posterior oropharyngeal erythema or uvula swelling.  ?   Tonsils: No tonsillar exudate. 0 on the right. 0 on the left.  ?   Comments: Postnasal drip ?Eyes:  ?   General: Lids are normal.     ?   Right eye: No discharge.     ?   Left eye: No discharge.  ?   Extraocular Movements: Extraocular movements intact.  ?   Conjunctiva/sclera: Conjunctivae normal.  ?   Right eye: Right conjunctiva is not injected.  ?   Left eye: Left conjunctiva is not injected.  ?Neck:  ?   Trachea: Trachea and phonation normal.  ?Cardiovascular:  ?   Rate and Rhythm: Normal rate and regular rhythm.  ?   Pulses: Normal pulses.  ?   Heart sounds: Normal heart sounds. No murmur heard. ?  No friction rub. No  gallop.  ?Pulmonary:  ?   Effort: Pulmonary effort is normal. No tachypnea, bradypnea, accessory muscle usage, prolonged expiration, respiratory distress or retractions.  ?   Breath sounds: Normal breath sounds. No stridor, decreased air movement or transmitted upper airway sounds. No decreased breath sounds, wheezing, rhonchi or  rales.  ?Chest:  ?   Chest wall: No tenderness.  ?Musculoskeletal:     ?   General: Normal range of motion.  ?   Cervical back: Normal range of motion and neck supple. Normal range of motion.  ?Lymphadenopathy:  ?   Cervical: No cervical adenopathy.  ?Skin: ?   General: Skin is warm and dry.  ?   Findings: No erythema or rash.  ?Neurological:  ?   General: No focal deficit present.  ?   Mental Status: She is alert and oriented to person, place, and time.  ?Psychiatric:     ?   Mood and Affect: Mood normal.     ?   Behavior: Behavior normal.  ? ? ?Visual Acuity ?Right Eye Distance:   ?Left Eye Distance:   ?Bilateral Distance:   ? ?Right Eye Near:   ?Left Eye Near:    ?Bilateral Near:    ? ?UC Couse / Diagnostics / Procedures:  ?  ?EKG ? ?Radiology ?DG Chest 2 View ? ?Result Date: 01/17/2022 ?CLINICAL DATA:  Dry cough EXAM: CHEST - 2 VIEW COMPARISON:  None FINDINGS: Cardiomediastinal silhouette and pulmonary vasculature are within normal limits. Lungs are clear. IMPRESSION: No acute cardiopulmonary process. Electronically Signed   By: Miachel Roux M.D.   On: 01/17/2022 14:22   ? ?Procedures ?Procedures (including critical care time) ? ?UC Diagnoses / Final Clinical Impressions(s)   ?I have reviewed the triage vital signs and the nursing notes. ? ?Pertinent labs & imaging results that were available during my care of the patient were reviewed by me and considered in my medical decision making (see chart for details).   ?Final diagnoses:  ?Seasonal allergic rhinitis, unspecified trigger  ?Nonproductive cough  ? ?Patient advised that I do not believe x-rays indicated but she is requesting 1 so 1  was provided for her today.  Chest x-ray did not reveal any acute findings.  Patient was provided with a steroid injection at her request, which I find interesting given that she denies history of allergies.  I

## 2022-02-12 ENCOUNTER — Other Ambulatory Visit (HOSPITAL_BASED_OUTPATIENT_CLINIC_OR_DEPARTMENT_OTHER): Payer: Self-pay

## 2022-02-12 ENCOUNTER — Encounter: Payer: Self-pay | Admitting: Family

## 2022-02-12 ENCOUNTER — Ambulatory Visit (INDEPENDENT_AMBULATORY_CARE_PROVIDER_SITE_OTHER): Payer: 59 | Admitting: Family

## 2022-02-12 VITALS — BP 114/81 | HR 80 | Ht 62.0 in | Wt 139.0 lb

## 2022-02-12 DIAGNOSIS — Z1231 Encounter for screening mammogram for malignant neoplasm of breast: Secondary | ICD-10-CM | POA: Diagnosis not present

## 2022-02-12 DIAGNOSIS — Z0000 Encounter for general adult medical examination without abnormal findings: Secondary | ICD-10-CM

## 2022-02-12 DIAGNOSIS — Z13220 Encounter for screening for lipoid disorders: Secondary | ICD-10-CM

## 2022-02-12 DIAGNOSIS — D219 Benign neoplasm of connective and other soft tissue, unspecified: Secondary | ICD-10-CM

## 2022-02-12 DIAGNOSIS — N920 Excessive and frequent menstruation with regular cycle: Secondary | ICD-10-CM

## 2022-02-12 DIAGNOSIS — R1310 Dysphagia, unspecified: Secondary | ICD-10-CM

## 2022-02-12 LAB — LIPID PANEL
Cholesterol: 179 mg/dL (ref 0–200)
HDL: 57.8 mg/dL (ref >39.00)
LDL Cholesterol: 113 mg/dL — ABNORMAL HIGH (ref 0–99)
NonHDL: 121.32
Total CHOL/HDL Ratio: 3
Triglycerides: 44 mg/dL (ref 0.0–149.0)
VLDL: 8.8 mg/dL (ref 0.0–40.0)

## 2022-02-12 LAB — CBC WITH DIFFERENTIAL/PLATELET
Basophils Absolute: 0.1 10*3/uL (ref 0.0–0.1)
Basophils Relative: 1 % (ref 0.0–3.0)
Eosinophils Absolute: 0.1 10*3/uL (ref 0.0–0.7)
Eosinophils Relative: 2.1 % (ref 0.0–5.0)
HCT: 40.1 % (ref 36.0–46.0)
Hemoglobin: 13.6 g/dL (ref 12.0–15.0)
Lymphocytes Relative: 24.5 % (ref 12.0–46.0)
Lymphs Abs: 1.4 10*3/uL (ref 0.7–4.0)
MCHC: 34 g/dL (ref 30.0–36.0)
MCV: 84.9 fl (ref 78.0–100.0)
Monocytes Absolute: 0.4 10*3/uL (ref 0.1–1.0)
Monocytes Relative: 7.5 % (ref 3.0–12.0)
Neutro Abs: 3.7 10*3/uL (ref 1.4–7.7)
Neutrophils Relative %: 64.9 % (ref 43.0–77.0)
Platelets: 298 10*3/uL (ref 150.0–400.0)
RBC: 4.73 Mil/uL (ref 3.87–5.11)
RDW: 13.8 % (ref 11.5–15.5)
WBC: 5.7 10*3/uL (ref 4.0–10.5)

## 2022-02-12 LAB — RESULTS CONSOLE HPV: CHL HPV: NEGATIVE

## 2022-02-12 LAB — COMPREHENSIVE METABOLIC PANEL
ALT: 8 U/L (ref 0–35)
AST: 10 U/L (ref 0–37)
Albumin: 4.4 g/dL (ref 3.5–5.2)
Alkaline Phosphatase: 73 U/L (ref 39–117)
BUN: 13 mg/dL (ref 6–23)
CO2: 27 mEq/L (ref 19–32)
Calcium: 9.5 mg/dL (ref 8.4–10.5)
Chloride: 106 mEq/L (ref 96–112)
Creatinine, Ser: 0.7 mg/dL (ref 0.40–1.20)
GFR: 107.59 mL/min (ref >60.00)
Glucose, Bld: 86 mg/dL (ref 70–99)
Potassium: 4.8 mEq/L (ref 3.5–5.1)
Sodium: 139 mEq/L (ref 135–145)
Total Bilirubin: 0.7 mg/dL (ref 0.2–1.2)
Total Protein: 7.2 g/dL (ref 6.0–8.3)

## 2022-02-12 LAB — TSH: TSH: 2.13 u[IU]/mL (ref 0.35–5.50)

## 2022-02-12 LAB — HM PAP SMEAR

## 2022-02-12 MED ORDER — VALACYCLOVIR HCL 500 MG PO TABS
ORAL_TABLET | ORAL | 1 refills | Status: DC
  Filled 2022-02-12: qty 30, 30d supply, fill #0

## 2022-02-12 NOTE — Progress Notes (Signed)
Tiffany Glass is a 41 y.o. female with the following history as recorded in EpicCare:  Patient Active Problem List   Diagnosis Date Noted   Fibroid 08/12/2018   HSV infection 02/28/2017   Upper back pain on right side 08/16/2016   Neck pain 08/16/2016   Tension type headache 07/05/2016   PPD screening test 03/03/2015   Perennial allergic rhinitis with seasonal variation 12/30/2014   Chronic idiopathic urticaria 12/30/2014   Bacterial vaginosis 12/12/2014   Angioedema 05/09/2014    Current Outpatient Medications  Medication Sig Dispense Refill   valACYclovir (VALTREX) 500 MG tablet Use as directed for flare ups 30 tablet 1   No current facility-administered medications for this visit.    Allergies: Patient has no known allergies.  History reviewed. No pertinent past medical history.  Past Surgical History:  Procedure Laterality Date   CESAREAN SECTION     TOE SURGERY Bilateral     Family History  Problem Relation Age of Onset   Diabetes Mother    Heart disease Mother    Hyperlipidemia Mother    Hypertension Mother    Stroke Mother    Diabetes Father    Sudden death Brother 52   Diabetes Maternal Grandmother    Hyperlipidemia Maternal Grandmother    Hypertension Maternal Grandmother    Stroke Maternal Grandmother    Diabetes Paternal Grandmother     Social History   Tobacco Use   Smoking status: Never   Smokeless tobacco: Not on file  Substance Use Topics   Alcohol use: No    Subjective:   Presents today as a new patient; needs yearly CPE; does feel that her periods have been getting much heavier recently; does need to get scheduled for mammogram; also feels that food occasionally gets stuck when she swallows.   Review of Systems  Constitutional: Negative.   HENT: Negative.    Eyes: Negative.   Respiratory: Negative.    Cardiovascular: Negative.   Gastrointestinal: Negative.   Genitourinary: Negative.   Musculoskeletal: Negative.   Skin: Negative.    Neurological: Negative.   Endo/Heme/Allergies: Negative.   Psychiatric/Behavioral: Negative.        Objective:  Vitals:   02/12/22 0925  BP: 114/81  Pulse: 80  SpO2: 99%  Weight: 139 lb (63 kg)  Height: $Remove'5\' 2"'kohZSmg$  (1.575 m)    General: Well developed, well nourished, in no acute distress  Skin : Warm and dry.  Head: Normocephalic and atraumatic  Eyes: Sclera and conjunctiva clear; pupils round and reactive to light; extraocular movements intact  Ears: External normal; canals clear; tympanic membranes normal  Oropharynx: Pink, supple. No suspicious lesions  Neck: Supple without thyromegaly, adenopathy  Lungs: Respirations unlabored; clear to auscultation bilaterally without wheeze, rales, rhonchi  CVS exam: normal rate and regular rhythm.  Abdomen: Soft; nontender; nondistended; normoactive bowel sounds; no masses or hepatosplenomegaly  Musculoskeletal: No deformities; no active joint inflammation  Extremities: No edema, cyanosis, clubbing  Vessels: Symmetric bilaterally  Neurologic: Alert and oriented; speech intact; face symmetrical; moves all extremities well; CNII-XII intact without focal deficit  Assessment:  1. PE (physical exam), annual   2. Visit for screening mammogram   3. Menorrhagia with regular cycle   4. Fibroid   5. Dysphagia, unspecified type   6. Lipid screening     Plan:  Age appropriate preventive healthcare needs addressed; encouraged regular eye doctor and dental exams; encouraged regular exercise; will update labs and refills as needed today; follow-up to be determined; Order for mammogram  updated; refer to GYN and GI as discussed; Follow up in 1 year, sooner prn.   No follow-ups on file.  Orders Placed This Encounter  Procedures   MM Digital Screening    Standing Status:   Future    Standing Expiration Date:   02/13/2023    Order Specific Question:   Reason for Exam (SYMPTOM  OR DIAGNOSIS REQUIRED)    Answer:   screening mammogram    Order Specific  Question:   Is the patient pregnant?    Answer:   No    Order Specific Question:   Preferred imaging location?    Answer:   [facility]   CBC with Differential/Platelet   Comp Met (CMET)   Lipid panel   TSH   Ambulatory referral to Obstetrics / Gynecology    Referral Priority:   Routine    Referral Type:   Consultation    Referral Reason:   Specialty Services Required    Requested Specialty:   Obstetrics and Gynecology    Number of Visits Requested:   1   Ambulatory referral to Gastroenterology    Referral Priority:   Routine    Referral Type:   Consultation    Referral Reason:   Specialty Services Required    Number of Visits Requested:   1    Requested Prescriptions   Signed Prescriptions Disp Refills   valACYclovir (VALTREX) 500 MG tablet 30 tablet 1    Sig: Use as directed for flare ups

## 2022-02-12 NOTE — Patient Instructions (Signed)
Uterine Fibroids  Uterine fibroids are lumps of tissue (tumors) in the womb (uterus). Fibroids are not cancerous. Most women with this condition do not need treatment. Sometimes, fibroids can make it harder to have children. If this happens, you may need surgery to take out the fibroids. What are the causes? The cause of this condition is not known. What increases the risk? You are in your 30s or 40s and have not gone through menopause. Menopause is when you have not had a menstrual period for 12 months. Having a history of fibroids in your family. You are of African American descent. You started your period at age 10 or younger. You have not given birth. You are overweight or very overweight. What are the signs or symptoms? Bleeding between menstrual periods. Heavy bleeding during your menstrual period. Pain in the area between your hips. Needing to pee (urinate) right away or more often than usual. Not being able to have children (infertility). Not being able to stay pregnant (miscarriage). Many women do not have symptoms.  How is this treated? Treatment may include: Follow-up visits with your doctor to check your fibroids for any changes. Medicines to help with pain, such as aspirin or ibuprofen. Hormone therapy. This may be given as a pill, in a shot, or with a type of birth control device called an IUD. Surgery that would do one of these things: Take out the fibroids. This may be done if you want to become pregnant. Take out the womb (hysterectomy). Stop the blood flow to the fibroids. Follow these instructions at home: Medicines Take over-the-counter and prescription medicines only as told by your doctor. Ask your doctor if you should: Take iron pills. Eat more foods that have a lot of iron in them, such as dark green, leafy vegetables. Managing pain If told, put heat on your back or belly. Do this as often as told by your doctor. Use the heat source that your doctor  recommends, such as a moist heat pack or a heating pad. To do this: Put a towel between your skin and the heat pack or pad. Leave the heat on for 20-30 minutes. Take off the heat if your skin turns bright red. This is very important. If you cannot feel pain, heat, or cold, you may have a greater risk of getting burned.  General instructions Tell your doctor about any changes to your menstrual period, such as: Heavy bleeding that needs a change of tampons or pads more than normal. A change in how many days your period lasts. A change in symptoms that come with your period. This might be belly cramps or back pain. Keep all follow-up visits. Contact a doctor if: You have pain that does not get better with medicine or heat. This may include pain or cramps in: The area between your hip bones. Your back. Your belly. You have new bleeding between your periods. You have more bleeding during or between your periods. You feel very tired or weak. You feel dizzy. Get help right away if: You faint. You have pain in the area between your hip bones that gets worse. You have bleeding that soaks a tampon or pad in 30 minutes or less. Summary Uterine fibroids are lumps of tissue (tumors) in your womb. They are not cancerous. Medicines such as aspirin or ibuprofen may be used to help with pain. Contact a doctor if you have pain or cramps that do not get better with medicine. Know the symptoms for when you   should get help right away. This information is not intended to replace advice given to you by your health care provider. Make sure you discuss any questions you have with your health care provider. Document Revised: 04/11/2020 Document Reviewed: 04/11/2020 Elsevier Patient Education  2023 Elsevier Inc[employer]  

## 2022-04-11 ENCOUNTER — Encounter: Payer: Self-pay | Admitting: Family

## 2022-05-14 ENCOUNTER — Ambulatory Visit (HOSPITAL_BASED_OUTPATIENT_CLINIC_OR_DEPARTMENT_OTHER)
Admission: RE | Admit: 2022-05-14 | Discharge: 2022-05-14 | Disposition: A | Discharge status: Home / Self Care | Payer: 59 | Source: Ambulatory Visit | Attending: Family | Admitting: Family

## 2022-05-14 ENCOUNTER — Encounter (HOSPITAL_BASED_OUTPATIENT_CLINIC_OR_DEPARTMENT_OTHER): Payer: Self-pay

## 2022-05-14 DIAGNOSIS — Z1231 Encounter for screening mammogram for malignant neoplasm of breast: Secondary | ICD-10-CM | POA: Insufficient documentation

## 2022-05-16 ENCOUNTER — Other Ambulatory Visit: Payer: Self-pay | Admitting: Family

## 2022-05-16 DIAGNOSIS — R928 Other abnormal and inconclusive findings on diagnostic imaging of breast: Secondary | ICD-10-CM

## 2022-06-18 ENCOUNTER — Ambulatory Visit: Payer: 59

## 2022-06-18 ENCOUNTER — Ambulatory Visit
Admission: RE | Admit: 2022-06-18 | Discharge: 2022-06-18 | Disposition: A | Discharge status: Home / Self Care | Payer: 59 | Source: Ambulatory Visit | Attending: Family | Admitting: Family

## 2022-06-18 DIAGNOSIS — R928 Other abnormal and inconclusive findings on diagnostic imaging of breast: Secondary | ICD-10-CM

## 2023-02-18 ENCOUNTER — Other Ambulatory Visit (HOSPITAL_BASED_OUTPATIENT_CLINIC_OR_DEPARTMENT_OTHER): Payer: Self-pay

## 2023-02-18 ENCOUNTER — Ambulatory Visit (INDEPENDENT_AMBULATORY_CARE_PROVIDER_SITE_OTHER): Payer: 59 | Admitting: Family

## 2023-02-18 ENCOUNTER — Encounter: Payer: Self-pay | Admitting: Family

## 2023-02-18 VITALS — BP 116/68 | HR 76 | Ht 62.0 in | Wt 150.0 lb

## 2023-02-18 DIAGNOSIS — Z13220 Encounter for screening for lipoid disorders: Secondary | ICD-10-CM | POA: Diagnosis not present

## 2023-02-18 DIAGNOSIS — R1310 Dysphagia, unspecified: Secondary | ICD-10-CM

## 2023-02-18 DIAGNOSIS — Z1231 Encounter for screening mammogram for malignant neoplasm of breast: Secondary | ICD-10-CM

## 2023-02-18 DIAGNOSIS — N920 Excessive and frequent menstruation with regular cycle: Secondary | ICD-10-CM

## 2023-02-18 DIAGNOSIS — R350 Frequency of micturition: Secondary | ICD-10-CM

## 2023-02-18 DIAGNOSIS — Z0000 Encounter for general adult medical examination without abnormal findings: Secondary | ICD-10-CM

## 2023-02-18 LAB — CBC WITH DIFFERENTIAL/PLATELET
Basophils Absolute: 0.1 10*3/uL (ref 0.0–0.1)
Basophils Relative: 1 % (ref 0.0–3.0)
Eosinophils Absolute: 0.1 10*3/uL (ref 0.0–0.7)
Eosinophils Relative: 2.7 % (ref 0.0–5.0)
HCT: 39.8 % (ref 36.0–46.0)
Hemoglobin: 12.8 g/dL (ref 12.0–15.0)
Lymphocytes Relative: 26 % (ref 12.0–46.0)
Lymphs Abs: 1.2 10*3/uL (ref 0.7–4.0)
MCHC: 32.1 g/dL (ref 30.0–36.0)
MCV: 83.2 fl (ref 78.0–100.0)
Monocytes Absolute: 0.3 10*3/uL (ref 0.1–1.0)
Monocytes Relative: 7.1 % (ref 3.0–12.0)
Neutro Abs: 3 10*3/uL (ref 1.4–7.7)
Neutrophils Relative %: 63.2 % (ref 43.0–77.0)
Platelets: 343 10*3/uL (ref 150.0–400.0)
RBC: 4.78 Mil/uL (ref 3.87–5.11)
RDW: 15 % (ref 11.5–15.5)
WBC: 4.8 10*3/uL (ref 4.0–10.5)

## 2023-02-18 LAB — COMPREHENSIVE METABOLIC PANEL
ALT: 12 U/L (ref 0–35)
AST: 13 U/L (ref 0–37)
Albumin: 4.1 g/dL (ref 3.5–5.2)
Alkaline Phosphatase: 89 U/L (ref 39–117)
BUN: 14 mg/dL (ref 6–23)
CO2: 26 mEq/L (ref 19–32)
Calcium: 9.1 mg/dL (ref 8.4–10.5)
Chloride: 104 mEq/L (ref 96–112)
Creatinine, Ser: 0.67 mg/dL (ref 0.40–1.20)
GFR: 107.96 mL/min (ref >60.00)
Glucose, Bld: 93 mg/dL (ref 70–99)
Potassium: 4.9 mEq/L (ref 3.5–5.1)
Sodium: 140 mEq/L (ref 135–145)
Total Bilirubin: 0.5 mg/dL (ref 0.2–1.2)
Total Protein: 7 g/dL (ref 6.0–8.3)

## 2023-02-18 LAB — LIPID PANEL
Cholesterol: 187 mg/dL (ref 0–200)
HDL: 52.8 mg/dL (ref >39.00)
LDL Cholesterol: 119 mg/dL — ABNORMAL HIGH (ref 0–99)
NonHDL: 134.65
Total CHOL/HDL Ratio: 4
Triglycerides: 78 mg/dL (ref 0.0–149.0)
VLDL: 15.6 mg/dL (ref 0.0–40.0)

## 2023-02-18 LAB — TSH: TSH: 1.33 u[IU]/mL (ref 0.35–5.50)

## 2023-02-18 LAB — HEMOGLOBIN A1C: Hgb A1c MFr Bld: 5.5 % (ref 4.6–6.5)

## 2023-02-18 MED ORDER — VALACYCLOVIR HCL 500 MG PO TABS
ORAL_TABLET | ORAL | 1 refills | Status: AC
  Filled 2023-02-18: qty 30, 30d supply, fill #0

## 2023-02-18 NOTE — Progress Notes (Signed)
Jamarria Kalla is a 42 y.o. female with the following history as recorded in Epic[employer]:  Patient Active Problem List   Diagnosis Date Noted   Fibroid 08/12/2018   HSV infection 02/28/2017   Upper back pain on right side 08/16/2016   Neck pain 08/16/2016   Tension type headache 07/05/2016   PPD screening test 03/03/2015   Perennial allergic rhinitis with seasonal variation 12/30/2014   Chronic idiopathic urticaria 12/30/2014   Bacterial vaginosis 12/12/2014   Angioedema 05/09/2014    Current Outpatient Medications  Medication Sig Dispense Refill   valACYclovir (VALTREX) 500 MG tablet Take as directed for flare ups. 30 tablet 1   No current facility-administered medications for this visit.    Allergies: Patient has no known allergies.  No past medical history on file.  Past Surgical History:  Procedure Laterality Date   CESAREAN SECTION     TOE SURGERY Bilateral     Family History  Problem Relation Age of Onset   Diabetes Mother    Heart disease Mother    Hyperlipidemia Mother    Hypertension Mother    Stroke Mother    Diabetes Father    Sudden death Brother 46   Diabetes Maternal Grandmother    Hyperlipidemia Maternal Grandmother    Hypertension Maternal Grandmother    Stroke Maternal Grandmother    Diabetes Paternal Grandmother     Social History   Tobacco Use   Smoking status: Never   Smokeless tobacco: Not on file  Substance Use Topics   Alcohol use: No    Subjective:   Presents for yearly CPE- needs updated for her employer; mammogram is due later this year; pap smear done in 01/2022;    Review of Systems  Constitutional: Negative.   HENT: Negative.    Eyes: Negative.   Respiratory: Negative.    Cardiovascular: Negative.   Gastrointestinal: Negative.   Genitourinary: Negative.   Musculoskeletal: Negative.   Skin: Negative.   Neurological: Negative.   Endo/Heme/Allergies: Negative.   Psychiatric/Behavioral: Negative.      Objective:  Vitals:    02/18/23 0922  BP: 116/68  Pulse: 76  SpO2: 99%  Weight: 150 lb (68 kg)  Height: 5\' 2"  (1.575 m)    General: Well developed, well nourished, in no acute distress  Skin : Warm and dry.  Head: Normocephalic and atraumatic  Eyes: Sclera and conjunctiva clear; pupils round and reactive to light; extraocular movements intact  Ears: External normal; canals clear; tympanic membranes normal  Oropharynx: Pink, supple. No suspicious lesions  Neck: Supple without thyromegaly, adenopathy  Lungs: Respirations unlabored; clear to auscultation bilaterally without wheeze, rales, rhonchi  CVS exam: normal rate and regular rhythm.  Abdomen: Soft; nontender; nondistended; normoactive bowel sounds; no masses or hepatosplenomegaly  Musculoskeletal: No deformities; no active joint inflammation  Extremities: No edema, cyanosis, clubbing  Vessels: Symmetric bilaterally  Neurologic: Alert and oriented; speech intact; face symmetrical; moves all extremities well; CNII-XII intact without focal deficit   Assessment:  1. PE (physical exam), annual   2. Dysphagia, unspecified type   3. Menorrhagia with regular cycle   4. Visit for screening mammogram   5. Lipid screening   6. Urinary frequency     Plan:  Age appropriate preventive healthcare needs addressed; encouraged regular eye doctor and dental exams; encouraged regular exercise; will update labs and refills as needed today; follow-up to be determined;   No follow-ups on file.  Orders Placed This Encounter  Procedures   Urine Culture   MM  Digital Screening    Standing Status:   Future    Standing Expiration Date:   02/18/2024    Scheduling Instructions:     Due at end of September 2024    Order Specific Question:   Reason for Exam (SYMPTOM  OR DIAGNOSIS REQUIRED)    Answer:   screening mammogram    Order Specific Question:   Is the patient pregnant?    Answer:   No    Order Specific Question:   Preferred imaging location?    Answer:   [facility]   CBC with Differential/Platelet   Comp Met (CMET)   Lipid panel   TSH   Hemoglobin A1c   Ambulatory referral to Obstetrics / Gynecology    Referral Priority:   Routine    Referral Type:   Consultation    Referral Reason:   Specialty Services Required    Requested Specialty:   Obstetrics and Gynecology    Number of Visits Requested:   1   Ambulatory referral to Gastroenterology    Referral Priority:   Routine    Referral Type:   Consultation    Referral Reason:   Specialty Services Required    Number of Visits Requested:   1    Requested Prescriptions   Signed Prescriptions Disp Refills   valACYclovir (VALTREX) 500 MG tablet 30 tablet 1    Sig: Take as directed for flare ups.

## 2023-02-19 LAB — URINE CULTURE
MICRO NUMBER:: 15008084
Result:: NO GROWTH
SPECIMEN QUALITY:: ADEQUATE

## 2023-02-28 ENCOUNTER — Other Ambulatory Visit (HOSPITAL_BASED_OUTPATIENT_CLINIC_OR_DEPARTMENT_OTHER): Payer: Self-pay

## 2023-04-07 ENCOUNTER — Other Ambulatory Visit (HOSPITAL_BASED_OUTPATIENT_CLINIC_OR_DEPARTMENT_OTHER): Payer: Self-pay

## 2023-04-07 ENCOUNTER — Encounter: Payer: Self-pay | Admitting: Obstetrics and Gynecology

## 2023-04-07 ENCOUNTER — Ambulatory Visit (HOSPITAL_BASED_OUTPATIENT_CLINIC_OR_DEPARTMENT_OTHER)
Admission: RE | Admit: 2023-04-07 | Discharge: 2023-04-07 | Disposition: A | Discharge status: Home / Self Care | Payer: 59 | Source: Ambulatory Visit | Attending: Obstetrics and Gynecology | Admitting: Obstetrics and Gynecology

## 2023-04-07 ENCOUNTER — Ambulatory Visit: Payer: 59 | Admitting: Obstetrics and Gynecology

## 2023-04-07 VITALS — BP 118/71 | HR 75 | Ht 62.0 in | Wt 151.0 lb

## 2023-04-07 DIAGNOSIS — D259 Leiomyoma of uterus, unspecified: Secondary | ICD-10-CM | POA: Diagnosis not present

## 2023-04-07 DIAGNOSIS — N939 Abnormal uterine and vaginal bleeding, unspecified: Secondary | ICD-10-CM | POA: Insufficient documentation

## 2023-04-07 MED ORDER — TRANEXAMIC ACID 650 MG PO TABS
1300.0000 mg | ORAL_TABLET | Freq: Three times a day (TID) | ORAL | 2 refills | Status: AC
  Filled 2023-04-07: qty 30, 5d supply, fill #0

## 2023-04-07 NOTE — Progress Notes (Signed)
New patient presents for heavy periods. Armandina Stammer RN

## 2023-04-07 NOTE — Progress Notes (Signed)
NEW GYNECOLOGY PATIENT Patient name: Tiffany Glass MRN [id number]  Date of birth: 24-Aug-1981 Chief Complaint:   Menstrual Problem     History:  Tiffany Glass is a 42 y.o. G1P0 being seen today for heavy menses. Have always been heavy and now very heavy and told it was because seh is over 40. Mirena IUD = amenorrhea, removed >5 years  and thought removing it would help with weight loss and had significant pain with removal and insertion. Pads for menses, changing every 2 hours. No dizziness or lightheadedness and may have overflow.    No hair or skin chages, no breast or nipple discharge. No hot flashes or vaginal dryness. Husband had vasectomy. May have occasional pain with intercourse = not consistent. Will have frequent urination as well.   Having pain in LLQ with and without menses. An annoying pain, intermitttent and may have pain in lower back. If hurts a lot will take ibuprofen and that will help.   Would want IUD and then abaltion if that doesn thelp  No hysterectomy for now      Gynecologic History Patient's last menstrual period was 04/05/2023. Contraception: vasectomy Last Pap: 12/2019 NILM, HPV neg Last Mammogram: 05/2022 BIRADS 1 Last Colonoscopy: n/a  Obstetric History OB History  Gravida Para Term Preterm AB Living  1         2  SAB IAB Ectopic Multiple Live Births        1 2    # Outcome Date GA Lbr Len/2nd Weight Sex Type Anes PTL Lv  1 Gravida      CS-Unspec       Past Medical History:  Diagnosis Date   Vaginal Pap smear, abnormal     Past Surgical History:  Procedure Laterality Date   CESAREAN SECTION     TOE SURGERY Bilateral     Current Outpatient Medications on File Prior to Visit  Medication Sig Dispense Refill   valACYclovir (VALTREX) 500 MG tablet Take as directed for flare ups. (Patient not taking: Reported on 04/07/2023) 30 tablet 1   No current facility-administered medications on file prior to visit.    No Known Allergies  Social History:   reports that she has never smoked. She does not have any smokeless tobacco history on file. She reports that she does not drink alcohol and does not use drugs.  Family History  Problem Relation Age of Onset   Diabetes Mother    Heart disease Mother    Hyperlipidemia Mother    Hypertension Mother    Stroke Mother    Diabetes Father    Sudden death Brother 22   Diabetes Maternal Grandmother    Hyperlipidemia Maternal Grandmother    Hypertension Maternal Grandmother    Stroke Maternal Grandmother    Diabetes Paternal Grandmother     The following portions of the patient's history were reviewed and updated as appropriate: allergies, current medications, past family history, past medical history, past social history, past surgical history and problem list.  Review of Systems Pertinent items noted in HPI and remainder of comprehensive ROS otherwise negative.  Physical Exam:  BP 118/71   Pulse 75   Ht 5\' 2"  (1.575 m)   Wt 151 lb (68.5 kg)   LMP 04/05/2023   BMI 27.62 kg/m  Physical Exam Vitals and nursing note reviewed.  Constitutional:      Appearance: Normal appearance.  Cardiovascular:     Rate and Rhythm: Normal rate.  Pulmonary:     Effort:  Pulmonary effort is normal.     Breath sounds: Normal breath sounds.  Neurological:     General: No focal deficit present.     Mental Status: She is alert and oriented to person, place, and time.  Psychiatric:        Mood and Affect: Mood normal.        Behavior: Behavior normal.        Thought Content: Thought content normal.        Judgment: Judgment normal.        Assessment and Plan:   1. Abnormal uterine bleeding (AUB) Patient has abnormal uterine bleeding .   Will order pelvic ultrasound to evaluate for any structural gynecologic abnormalities.  Will contact patient with these results and plans for further evaluation/management. TSH and A1c on file within normal limits. Likely perimenopausal changes of menses. Discussed  management options for abnormal uterine bleeding including NSAIDs (Naproxen), tranexamic acid (Lysteda), oral progesterone, Depo Provera, Levonogestrel IUD, endometrial ablation or hysterectomy as definitive surgical management.  Discussed risks and benefits of each method.   Patient desires  Lysteda for now. If improvement will continue, if no improvement would like to proceed with endometrial ablation.   - US PELVIC COMPLETE WITH TRANSVAGINAL; Future - tranexamic acid (LYSTEDA) 650 MG TABS tablet; Take 2 tablets (1,300 mg total) by mouth 3 (three) times daily. Take during menses for a maximum of five days.  Dispense: 30 tablet; Refill: 2   Routine preventative health maintenance measures emphasized. Please refer to After Visit Summary for other counseling recommendations.   Follow-up: Return for GYN Follow Up.     Lorriane Shire, MD Obstetrician & Gynecologist, Faculty Practice [facility] for [employer], [facility]

## 2023-04-16 ENCOUNTER — Telehealth: Payer: Self-pay

## 2023-04-16 NOTE — Telephone Encounter (Signed)
Left message for patient to return call to office for results. Jennifer Howard RN  

## 2023-04-16 NOTE — Telephone Encounter (Signed)
-----   Message from Jerene Bears sent at 04/16/2023  6:43 AM EDT ----- Please let pt know her ultrasound showed a 4.7cm fibroid.  She is having bleeding issues.  Has follow up scheduled with Dr. Briscoe Deutscher on 8/19 and will discuss this further including treatment options.  Thank you.  Leda Quail, MD, covering for Dr. Briscoe Deutscher

## 2023-05-09 ENCOUNTER — Other Ambulatory Visit (HOSPITAL_COMMUNITY): Payer: Self-pay

## 2023-05-12 ENCOUNTER — Encounter: Payer: Self-pay | Admitting: Obstetrics and Gynecology

## 2023-05-12 ENCOUNTER — Ambulatory Visit (INDEPENDENT_AMBULATORY_CARE_PROVIDER_SITE_OTHER): Payer: 59 | Admitting: Obstetrics and Gynecology

## 2023-05-12 VITALS — BP 113/76 | HR 81 | Wt 149.0 lb

## 2023-05-12 DIAGNOSIS — N939 Abnormal uterine and vaginal bleeding, unspecified: Secondary | ICD-10-CM

## 2023-05-12 NOTE — Progress Notes (Signed)
    GYNECOLOGY VISIT  Patient name: Tiffany Glass MRN [id number]  Date of birth: [birth year] Chief Complaint:   Menstrual Problem   History:  Tiffany Glass is a 42 y.o. G1P0 being seen today for AUB follow up. Has not had another cycle since last visit to try out the TXA. She wants to avoid surgery at all costs if able. If medication doesn't work, would like mirena IUD again as she was amenorrheic with it last time.     Past Medical History:  Diagnosis Date   Vaginal Pap smear, abnormal     Past Surgical History:  Procedure Laterality Date   CESAREAN SECTION     TOE SURGERY Bilateral     The following portions of the patient's history were reviewed and updated as appropriate: allergies, current medications, past family history, past medical history, past social history, past surgical history and problem list.   Health Maintenance:   Last pap  Last Pap: 12/2019 NILM, HPV neg  Last mammogram: 05/2022 BIRADS 1   Review of Systems:  Pertinent items are noted in HPI. Comprehensive review of systems was otherwise negative.   Objective:  Physical Exam BP 113/76   Pulse 81   Wt 149 lb (67.6 kg)   LMP 05/03/2023 (Exact Date)   BMI 27.25 kg/m    Physical Exam Vitals and nursing note reviewed.  Constitutional:      Appearance: Normal appearance.  HENT:     Head: Normocephalic and atraumatic.  Pulmonary:     Effort: Pulmonary effort is normal.  Skin:    General: Skin is warm and dry.  Neurological:     General: No focal deficit present.     Mental Status: She is alert.  Psychiatric:        Mood and Affect: Mood normal.        Behavior: Behavior normal.        Thought Content: Thought content normal.        Judgment: Judgment normal.        Assessment & Plan:   1. Abnormal uterine bleeding (AUB) Reviewed [place] results and note of fibroids. Trial of TXA with upcoming menses, if no improvement will plan for Mirena IUD insertion. Also briefly reviewed other procedural options  including UFE, endometrial ablation, myomectomy, sonata, and hysterectomy.    Routine preventative health maintenance measures emphasized.  Lorriane Shire, MD [facility] for [facility], [facility]

## 2023-06-11 ENCOUNTER — Telehealth: Payer: Self-pay

## 2023-06-11 NOTE — Telephone Encounter (Signed)
Left voicemail for patient to call back and schedule her 3 month follow up.

## 2023-06-23 ENCOUNTER — Ambulatory Visit (HOSPITAL_BASED_OUTPATIENT_CLINIC_OR_DEPARTMENT_OTHER)
Admission: RE | Admit: 2023-06-23 | Discharge: 2023-06-23 | Disposition: A | Discharge status: Home / Self Care | Payer: 59 | Source: Ambulatory Visit | Attending: Family | Admitting: Family

## 2023-06-23 ENCOUNTER — Encounter (HOSPITAL_BASED_OUTPATIENT_CLINIC_OR_DEPARTMENT_OTHER): Payer: Self-pay

## 2023-06-23 DIAGNOSIS — Z1231 Encounter for screening mammogram for malignant neoplasm of breast: Secondary | ICD-10-CM | POA: Diagnosis not present

## 2023-07-11 ENCOUNTER — Other Ambulatory Visit (HOSPITAL_BASED_OUTPATIENT_CLINIC_OR_DEPARTMENT_OTHER): Payer: Self-pay

## 2023-07-11 MED ORDER — INFLUENZA VIRUS VACC SPLIT PF (FLUZONE) 0.5 ML IM SUSY
0.5000 mL | PREFILLED_SYRINGE | Freq: Once | INTRAMUSCULAR | 0 refills | Status: AC
  Filled 2023-07-11: qty 0.5, 1d supply, fill #0

## 2023-07-24 IMAGING — DX DG CHEST 2V
2 series · 2 of 2 positions shown · non-contrast
Comparison: None

CLINICAL DATA: Dry cough

EXAM:
CHEST - 2 VIEW

[chest pa]
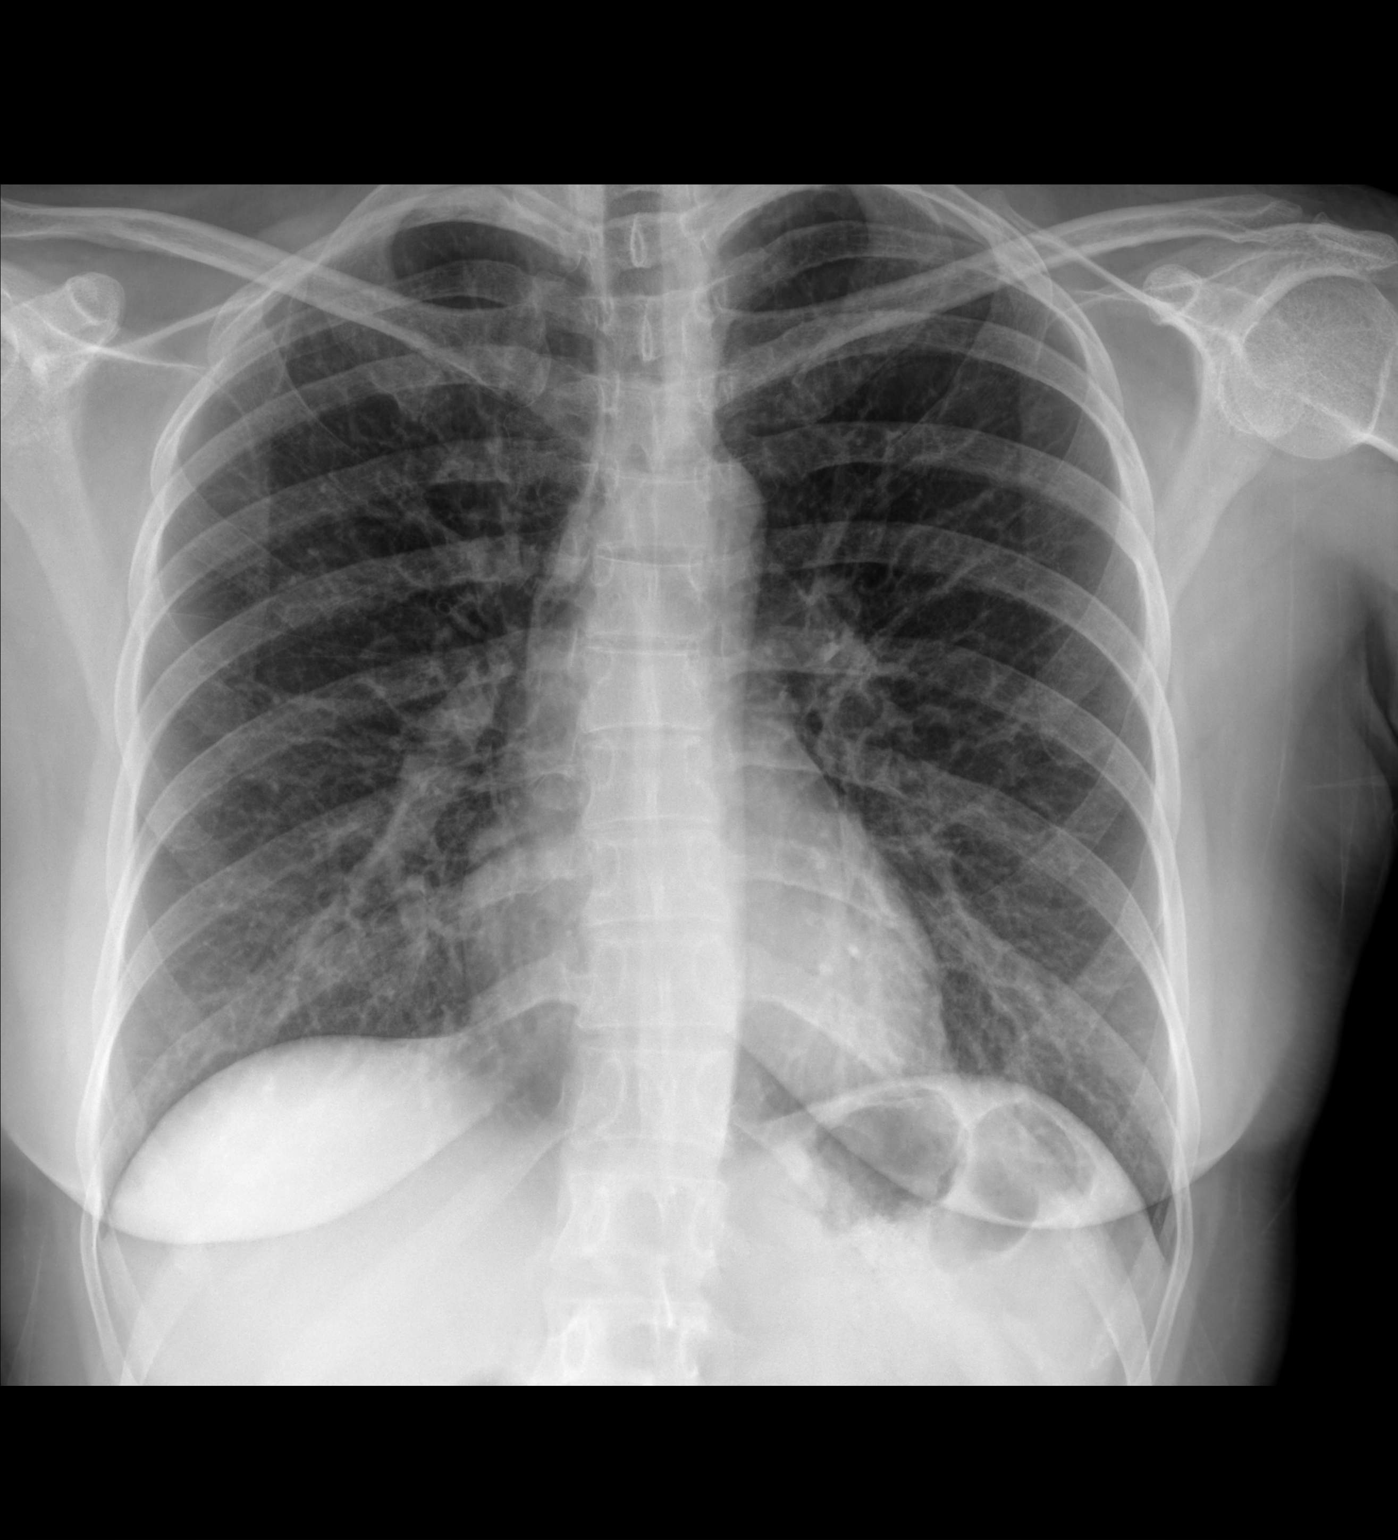

[chest lat]
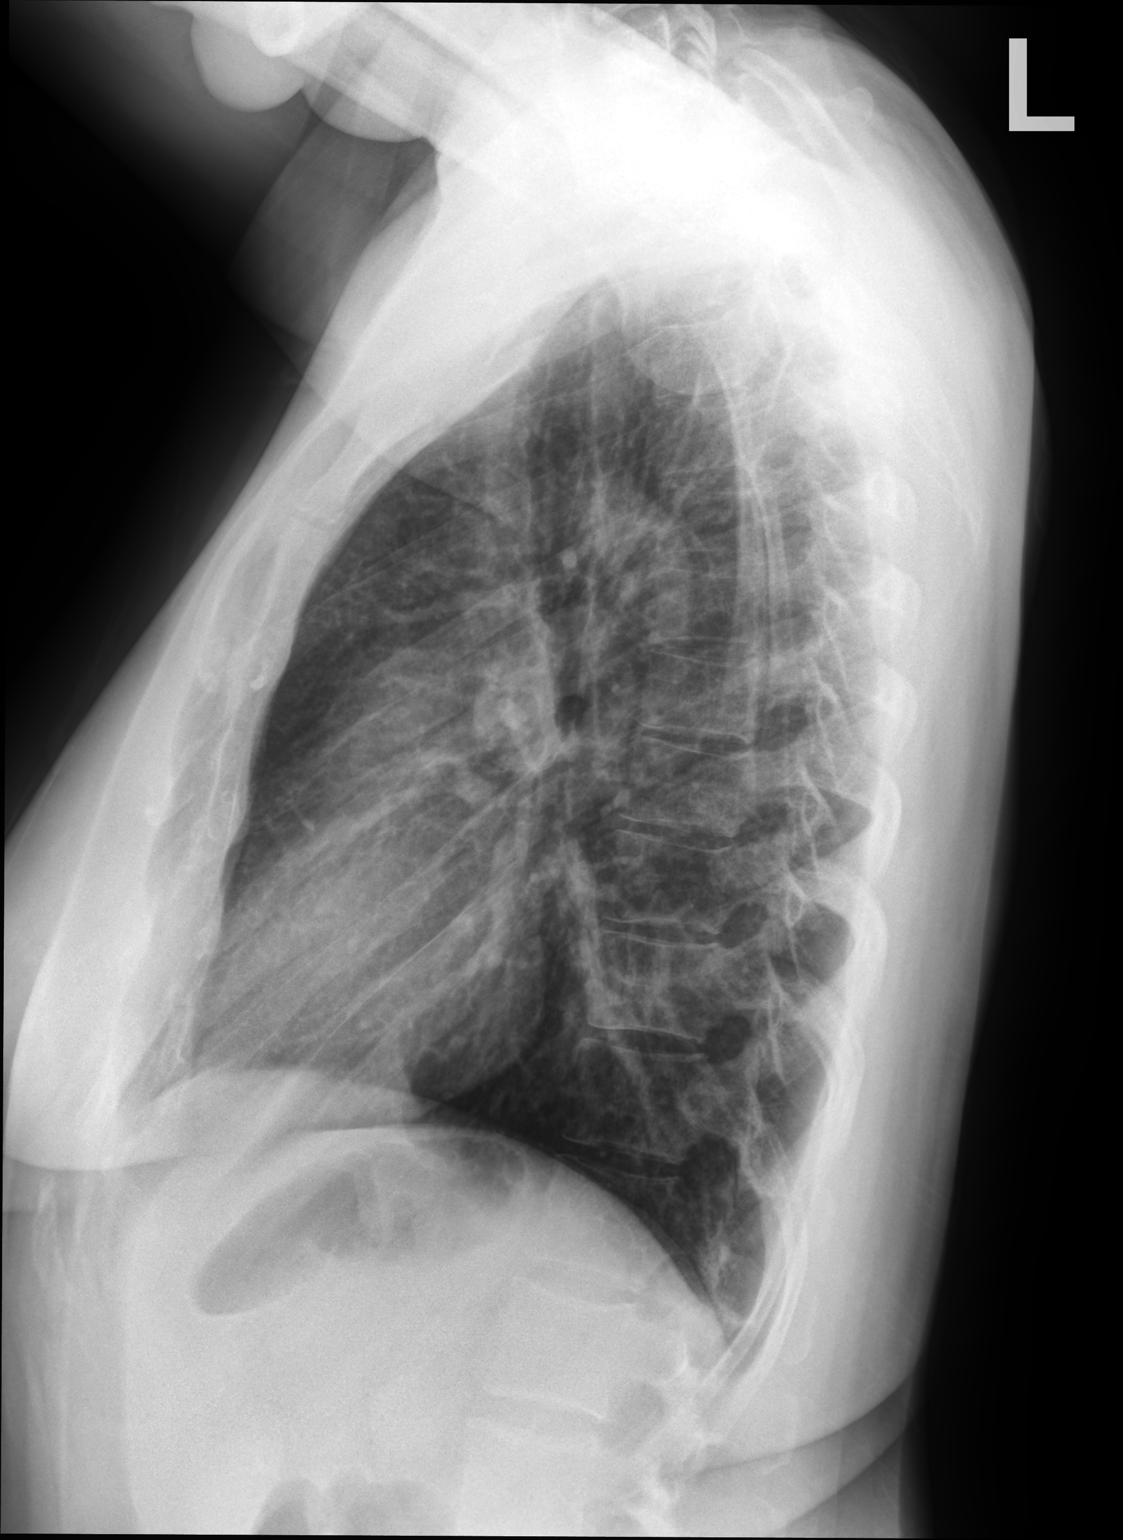

[2 of 2 positions shown; findings below may reference images not displayed]

FINDINGS: Cardiomediastinal silhouette and pulmonary vasculature are within
normal limits.

Lungs are clear.
IMPRESSION: No acute cardiopulmonary process.

## 2024-02-24 ENCOUNTER — Encounter: Payer: 59 | Admitting: Family
